# Patient Record
Sex: Male | Born: 1985 | Race: Black or African American | Hispanic: No | Marital: Single | Smoking: Current some day smoker
Health system: Southern US, Community
[De-identification: ages and names within clinical notes are randomized; demographics above are authoritative.]

## PROBLEM LIST (undated history)

## (undated) DIAGNOSIS — J40 Bronchitis, not specified as acute or chronic: Secondary | ICD-10-CM

## (undated) DIAGNOSIS — B20 Human immunodeficiency virus [HIV] disease: Secondary | ICD-10-CM

## (undated) DIAGNOSIS — J45909 Unspecified asthma, uncomplicated: Secondary | ICD-10-CM

## (undated) DIAGNOSIS — I10 Essential (primary) hypertension: Secondary | ICD-10-CM

## (undated) HISTORY — PX: HAND SURGERY: SHX662

---
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
## 2004-01-25 ENCOUNTER — Emergency Department (HOSPITAL_COMMUNITY): Admission: EM | Admit: 2004-01-25 | Discharge: 2004-01-25 | Payer: Self-pay | Admitting: Emergency Medicine

## 2004-02-13 ENCOUNTER — Emergency Department (HOSPITAL_COMMUNITY): Admission: EM | Admit: 2004-02-13 | Discharge: 2004-02-14 | Payer: Self-pay | Admitting: Emergency Medicine

## 2004-05-03 ENCOUNTER — Emergency Department (HOSPITAL_COMMUNITY): Admission: EM | Admit: 2004-05-03 | Discharge: 2004-05-03 | Payer: Self-pay | Admitting: Emergency Medicine

## 2005-01-24 ENCOUNTER — Emergency Department (HOSPITAL_COMMUNITY): Admission: EM | Admit: 2005-01-24 | Discharge: 2005-01-24 | Payer: Self-pay | Admitting: Family Medicine

## 2005-01-28 ENCOUNTER — Emergency Department (HOSPITAL_COMMUNITY): Admission: EM | Admit: 2005-01-28 | Discharge: 2005-01-28 | Payer: Self-pay | Admitting: Emergency Medicine

## 2005-01-30 ENCOUNTER — Encounter: Admission: RE | Admit: 2005-01-30 | Discharge: 2005-01-30 | Payer: Self-pay | Admitting: General Surgery

## 2005-06-14 ENCOUNTER — Emergency Department (HOSPITAL_COMMUNITY): Admission: EM | Admit: 2005-06-14 | Discharge: 2005-06-14 | Payer: Self-pay | Admitting: Emergency Medicine

## 2005-07-07 ENCOUNTER — Emergency Department (HOSPITAL_COMMUNITY): Admission: EM | Admit: 2005-07-07 | Discharge: 2005-07-07 | Payer: Self-pay | Admitting: Emergency Medicine

## 2005-07-10 ENCOUNTER — Ambulatory Visit (HOSPITAL_COMMUNITY): Admission: RE | Admit: 2005-07-10 | Discharge: 2005-07-10 | Payer: Self-pay | Admitting: Orthopedic Surgery

## 2005-10-08 ENCOUNTER — Emergency Department (HOSPITAL_COMMUNITY): Admission: EM | Admit: 2005-10-08 | Discharge: 2005-10-08 | Payer: Self-pay | Admitting: Emergency Medicine

## 2005-11-17 ENCOUNTER — Emergency Department (HOSPITAL_COMMUNITY): Admission: EM | Admit: 2005-11-17 | Discharge: 2005-11-17 | Payer: Self-pay | Admitting: Family Medicine

## 2005-11-18 ENCOUNTER — Emergency Department (HOSPITAL_COMMUNITY): Admission: EM | Admit: 2005-11-18 | Discharge: 2005-11-18 | Payer: Self-pay | Admitting: Family Medicine

## 2006-02-09 ENCOUNTER — Emergency Department (HOSPITAL_COMMUNITY): Admission: EM | Admit: 2006-02-09 | Discharge: 2006-02-09 | Payer: Self-pay | Admitting: Family Medicine

## 2006-02-14 ENCOUNTER — Emergency Department (HOSPITAL_COMMUNITY): Admission: EM | Admit: 2006-02-14 | Discharge: 2006-02-14 | Payer: Self-pay | Admitting: Family Medicine

## 2006-02-15 ENCOUNTER — Emergency Department (HOSPITAL_COMMUNITY): Admission: EM | Admit: 2006-02-15 | Discharge: 2006-02-15 | Payer: Self-pay | Admitting: Emergency Medicine

## 2006-02-16 ENCOUNTER — Emergency Department (HOSPITAL_COMMUNITY): Admission: EM | Admit: 2006-02-16 | Discharge: 2006-02-16 | Payer: Self-pay | Admitting: Emergency Medicine

## 2006-02-21 ENCOUNTER — Emergency Department (HOSPITAL_COMMUNITY): Admission: EM | Admit: 2006-02-21 | Discharge: 2006-02-21 | Payer: Self-pay | Admitting: Emergency Medicine

## 2006-04-02 ENCOUNTER — Emergency Department (HOSPITAL_COMMUNITY): Admission: EM | Admit: 2006-04-02 | Discharge: 2006-04-02 | Payer: Self-pay | Admitting: Emergency Medicine

## 2006-04-03 ENCOUNTER — Observation Stay (HOSPITAL_COMMUNITY): Admission: EM | Admit: 2006-04-03 | Discharge: 2006-04-04 | Payer: Self-pay | Admitting: Emergency Medicine

## 2006-04-03 DIAGNOSIS — B20 Human immunodeficiency virus [HIV] disease: Secondary | ICD-10-CM

## 2006-04-04 ENCOUNTER — Encounter (INDEPENDENT_AMBULATORY_CARE_PROVIDER_SITE_OTHER): Payer: Self-pay | Admitting: *Deleted

## 2006-04-12 ENCOUNTER — Emergency Department (HOSPITAL_COMMUNITY): Admission: EM | Admit: 2006-04-12 | Discharge: 2006-04-12 | Payer: Self-pay | Admitting: Emergency Medicine

## 2006-04-23 ENCOUNTER — Encounter: Admission: RE | Admit: 2006-04-23 | Discharge: 2006-04-23 | Payer: Self-pay | Admitting: Internal Medicine

## 2006-04-23 ENCOUNTER — Ambulatory Visit: Payer: Self-pay | Admitting: Internal Medicine

## 2006-04-23 LAB — CONVERTED CEMR LAB
Basophils Absolute: 0 10*3/uL (ref 0.0–0.1)
Basophils Relative: 1 % (ref 0–1)
Bilirubin Urine: NEGATIVE
Creatinine, Ser: 0.88 mg/dL (ref 0.40–1.50)
Eosinophils Absolute: 0.2 10*3/uL (ref 0.0–0.7)
Eosinophils Relative: 4 % (ref 0–5)
GC Probe Amp, Urine: NEGATIVE
HCV Ab: NEGATIVE
HIV-1 RNA Quant, Log: 4.33 — ABNORMAL HIGH (ref <1.70)
Hemoglobin: 14.2 g/dL (ref 13.0–17.0)
Hepatitis B Surface Ag: NEGATIVE
Ketones, ur: NEGATIVE mg/dL
Leukocytes, UA: NEGATIVE
Lymphs Abs: 2.3 10*3/uL (ref 0.7–3.3)
Neutrophils Relative %: 35 % — ABNORMAL LOW (ref 43–77)
Potassium: 4.1 meq/L (ref 3.5–5.3)
Protein, ur: NEGATIVE mg/dL
Sodium: 141 meq/L (ref 135–145)
Specific Gravity, Urine: 1.029 (ref 1.005–1.03)
Total Bilirubin: 0.4 mg/dL (ref 0.3–1.2)
Total Protein: 7.6 g/dL (ref 6.0–8.3)
Urobilinogen, UA: 1 (ref 0.0–1.0)

## 2006-04-30 ENCOUNTER — Telehealth: Payer: Self-pay | Admitting: Internal Medicine

## 2006-05-09 ENCOUNTER — Emergency Department (HOSPITAL_COMMUNITY): Admission: EM | Admit: 2006-05-09 | Discharge: 2006-05-09 | Payer: Self-pay | Admitting: Emergency Medicine

## 2006-05-27 ENCOUNTER — Encounter: Payer: Self-pay | Admitting: Internal Medicine

## 2006-06-27 ENCOUNTER — Emergency Department (HOSPITAL_COMMUNITY): Admission: EM | Admit: 2006-06-27 | Discharge: 2006-06-27 | Payer: Self-pay | Admitting: Emergency Medicine

## 2006-08-03 ENCOUNTER — Encounter (INDEPENDENT_AMBULATORY_CARE_PROVIDER_SITE_OTHER): Payer: Self-pay | Admitting: *Deleted

## 2006-09-10 ENCOUNTER — Emergency Department (HOSPITAL_COMMUNITY): Admission: EM | Admit: 2006-09-10 | Discharge: 2006-09-10 | Payer: Self-pay | Admitting: Emergency Medicine

## 2007-01-06 ENCOUNTER — Emergency Department (HOSPITAL_COMMUNITY): Admission: EM | Admit: 2007-01-06 | Discharge: 2007-01-06 | Payer: Self-pay | Admitting: Emergency Medicine

## 2007-01-14 ENCOUNTER — Emergency Department (HOSPITAL_COMMUNITY): Admission: EM | Admit: 2007-01-14 | Discharge: 2007-01-14 | Payer: Self-pay | Admitting: Emergency Medicine

## 2007-01-26 ENCOUNTER — Encounter: Payer: Self-pay | Admitting: Internal Medicine

## 2007-01-26 ENCOUNTER — Encounter (INDEPENDENT_AMBULATORY_CARE_PROVIDER_SITE_OTHER): Payer: Self-pay | Admitting: *Deleted

## 2007-02-25 ENCOUNTER — Emergency Department (HOSPITAL_COMMUNITY): Admission: EM | Admit: 2007-02-25 | Discharge: 2007-02-25 | Payer: Self-pay | Admitting: Emergency Medicine

## 2007-03-01 ENCOUNTER — Emergency Department (HOSPITAL_COMMUNITY): Admission: EM | Admit: 2007-03-01 | Discharge: 2007-03-01 | Payer: Self-pay | Admitting: Emergency Medicine

## 2007-09-16 ENCOUNTER — Emergency Department (HOSPITAL_COMMUNITY): Admission: EM | Admit: 2007-09-16 | Discharge: 2007-09-16 | Payer: Self-pay | Admitting: Emergency Medicine

## 2007-11-13 ENCOUNTER — Emergency Department (HOSPITAL_COMMUNITY): Admission: EM | Admit: 2007-11-13 | Discharge: 2007-11-13 | Payer: Self-pay | Admitting: Emergency Medicine

## 2008-02-06 ENCOUNTER — Emergency Department (HOSPITAL_COMMUNITY): Admission: EM | Admit: 2008-02-06 | Discharge: 2008-02-06 | Payer: Self-pay | Admitting: Emergency Medicine

## 2008-03-01 ENCOUNTER — Emergency Department (HOSPITAL_COMMUNITY): Admission: EM | Admit: 2008-03-01 | Discharge: 2008-03-01 | Payer: Self-pay | Admitting: Emergency Medicine

## 2008-05-07 ENCOUNTER — Emergency Department (HOSPITAL_COMMUNITY): Admission: EM | Admit: 2008-05-07 | Discharge: 2008-05-07 | Payer: Self-pay | Admitting: Emergency Medicine

## 2008-05-18 ENCOUNTER — Inpatient Hospital Stay (HOSPITAL_COMMUNITY): Admission: EM | Admit: 2008-05-18 | Discharge: 2008-05-22 | Payer: Self-pay | Admitting: *Deleted

## 2008-05-19 ENCOUNTER — Ambulatory Visit: Payer: Self-pay | Admitting: Infectious Diseases

## 2008-05-22 ENCOUNTER — Telehealth: Payer: Self-pay | Admitting: Infectious Diseases

## 2008-06-07 ENCOUNTER — Ambulatory Visit: Payer: Self-pay | Admitting: Infectious Diseases

## 2008-06-07 DIAGNOSIS — B59 Pneumocystosis: Secondary | ICD-10-CM

## 2008-06-13 ENCOUNTER — Ambulatory Visit: Payer: Self-pay | Admitting: Infectious Diseases

## 2008-06-13 LAB — CONVERTED CEMR LAB
AST: 15 units/L (ref 0–37)
Albumin: 3.9 g/dL (ref 3.5–5.2)
Basophils Relative: 1 % (ref 0–1)
Chloride: 103 meq/L (ref 96–112)
Creatinine, Ser: 0.84 mg/dL (ref 0.40–1.50)
GFR calc Af Amer: >60 mL/min (ref >60)
HCT: 40 % (ref 39.0–52.0)
Hemoglobin: 13 g/dL (ref 13.0–17.0)
MCV: 82.8 fL (ref 78.0–100.0)
Monocytes Absolute: 0.9 10*3/uL (ref 0.1–1.0)
Neutro Abs: 3 10*3/uL (ref 1.7–7.7)
Phosphorus: 3.7 mg/dL (ref 2.3–4.6)
Potassium: 4.6 meq/L (ref 3.5–5.3)
RDW: 19.6 % — ABNORMAL HIGH (ref 11.5–15.5)
Sodium: 140 meq/L (ref 135–145)
Total Protein: 8.2 g/dL (ref 6.0–8.3)
WBC: 5.8 10*3/uL (ref 4.0–10.5)

## 2008-06-20 ENCOUNTER — Encounter (INDEPENDENT_AMBULATORY_CARE_PROVIDER_SITE_OTHER): Payer: Self-pay | Admitting: *Deleted

## 2008-06-20 ENCOUNTER — Ambulatory Visit: Payer: Self-pay | Admitting: Infectious Diseases

## 2008-06-29 ENCOUNTER — Telehealth: Payer: Self-pay | Admitting: Infectious Diseases

## 2008-07-05 ENCOUNTER — Ambulatory Visit: Payer: Self-pay | Admitting: Infectious Diseases

## 2008-07-09 ENCOUNTER — Emergency Department (HOSPITAL_COMMUNITY): Admission: EM | Admit: 2008-07-09 | Discharge: 2008-07-09 | Payer: Self-pay | Admitting: Emergency Medicine

## 2008-07-14 ENCOUNTER — Telehealth: Payer: Self-pay | Admitting: Infectious Diseases

## 2008-07-17 ENCOUNTER — Ambulatory Visit: Payer: Self-pay | Admitting: Infectious Diseases

## 2008-07-17 ENCOUNTER — Telehealth (INDEPENDENT_AMBULATORY_CARE_PROVIDER_SITE_OTHER): Payer: Self-pay | Admitting: *Deleted

## 2008-07-17 ENCOUNTER — Encounter: Payer: Self-pay | Admitting: Infectious Disease

## 2008-07-17 LAB — CONVERTED CEMR LAB
Albumin: 4.3 g/dL (ref 3.5–5.2)
Alkaline Phosphatase: 82 units/L (ref 39–117)
CO2: 25 meq/L (ref 19–32)
Creatinine, Ser: 0.85 mg/dL (ref 0.40–1.50)
GFR calc Af Amer: >60 mL/min (ref >60)
Glucose, Bld: 86 mg/dL (ref 70–99)
Indirect Bilirubin: 0.2 mg/dL (ref 0.0–0.9)
Phosphorus: 4.2 mg/dL (ref 2.3–4.6)
Potassium: 4.2 meq/L (ref 3.5–5.3)

## 2008-07-24 ENCOUNTER — Telehealth (INDEPENDENT_AMBULATORY_CARE_PROVIDER_SITE_OTHER): Payer: Self-pay | Admitting: *Deleted

## 2008-07-27 ENCOUNTER — Encounter: Payer: Self-pay | Admitting: *Deleted

## 2008-08-15 ENCOUNTER — Encounter (INDEPENDENT_AMBULATORY_CARE_PROVIDER_SITE_OTHER): Payer: Self-pay | Admitting: *Deleted

## 2008-08-17 ENCOUNTER — Ambulatory Visit: Payer: Self-pay | Admitting: Infectious Diseases

## 2008-08-17 ENCOUNTER — Encounter: Payer: Self-pay | Admitting: Infectious Diseases

## 2008-08-17 LAB — CONVERTED CEMR LAB
ALT: 11 units/L (ref 0–53)
Alkaline Phosphatase: 76 units/L (ref 39–117)
BUN: 7 mg/dL (ref 6–23)
Calcium: 9.5 mg/dL (ref 8.4–10.5)
Chloride: 108 meq/L (ref 96–112)
Glucose, Bld: 83 mg/dL (ref 70–99)
HIV 1 RNA Quant: 556 copies/mL
Potassium: 4.1 meq/L (ref 3.5–5.3)
Total Bilirubin: 0.3 mg/dL (ref 0.3–1.2)
Total Protein: 7.5 g/dL (ref 6.0–8.3)

## 2008-09-05 ENCOUNTER — Encounter (INDEPENDENT_AMBULATORY_CARE_PROVIDER_SITE_OTHER): Payer: Self-pay | Admitting: *Deleted

## 2008-09-13 ENCOUNTER — Ambulatory Visit: Payer: Self-pay | Admitting: Infectious Diseases

## 2008-09-18 ENCOUNTER — Emergency Department (HOSPITAL_COMMUNITY): Admission: EM | Admit: 2008-09-18 | Discharge: 2008-09-18 | Payer: Self-pay | Admitting: Emergency Medicine

## 2008-11-15 ENCOUNTER — Ambulatory Visit: Payer: Self-pay | Admitting: Infectious Diseases

## 2008-11-15 ENCOUNTER — Encounter: Payer: Self-pay | Admitting: Infectious Diseases

## 2008-11-15 LAB — CONVERTED CEMR LAB
AST: 19 units/L (ref 0–37)
BUN: 10 mg/dL (ref 6–23)
CD4 Count: 214 microliters
Creatinine, Ser: 1.08 mg/dL (ref 0.40–1.50)

## 2008-12-19 ENCOUNTER — Emergency Department (HOSPITAL_COMMUNITY): Admission: EM | Admit: 2008-12-19 | Discharge: 2008-12-19 | Payer: Self-pay | Admitting: Family Medicine

## 2009-01-03 ENCOUNTER — Ambulatory Visit: Payer: Self-pay | Admitting: Infectious Diseases

## 2009-01-03 LAB — CONVERTED CEMR LAB
ALT: 15 units/L (ref 0–53)
AST: 20 units/L (ref 0–37)
Albumin: 4.4 g/dL (ref 3.5–5.2)
Alkaline Phosphatase: 81 units/L (ref 39–117)
BUN: 9 mg/dL (ref 6–23)
Bilirubin, Direct: 0.1 mg/dL (ref 0.0–0.3)
Cholesterol: 94 mg/dL (ref 0–200)
Glucose, Bld: 83 mg/dL (ref 70–99)
Indirect Bilirubin: 0.1 mg/dL (ref 0.0–0.9)
LDL Cholesterol: 52 mg/dL (ref 0–99)
Potassium: 4.3 meq/L (ref 3.5–5.3)
Sodium: 139 meq/L (ref 135–145)
Total Bilirubin: 0.2 mg/dL — ABNORMAL LOW (ref 0.3–1.2)
Total Protein: 7.4 g/dL (ref 6.0–8.3)
VLDL: 16 mg/dL (ref 0–40)

## 2009-01-09 ENCOUNTER — Ambulatory Visit: Payer: Self-pay | Admitting: Infectious Diseases

## 2009-01-09 LAB — CONVERTED CEMR LAB: CD4 Count: 281 microliters

## 2009-01-23 ENCOUNTER — Telehealth (INDEPENDENT_AMBULATORY_CARE_PROVIDER_SITE_OTHER): Payer: Self-pay | Admitting: *Deleted

## 2009-01-24 ENCOUNTER — Ambulatory Visit: Payer: Self-pay | Admitting: Infectious Disease

## 2009-01-24 DIAGNOSIS — H679 Otitis media in diseases classified elsewhere, unspecified ear: Secondary | ICD-10-CM

## 2009-01-24 DIAGNOSIS — R51 Headache: Secondary | ICD-10-CM

## 2009-01-24 DIAGNOSIS — R519 Headache, unspecified: Secondary | ICD-10-CM | POA: Insufficient documentation

## 2009-01-24 LAB — CONVERTED CEMR LAB
ALT: 15 units/L (ref 0–53)
AST: 18 units/L (ref 0–37)
Alkaline Phosphatase: 85 units/L (ref 39–117)
BUN: 8 mg/dL (ref 6–23)
CRP: 0.1 mg/dL (ref <0.6)
Calcium: 9.1 mg/dL (ref 8.4–10.5)
Creatinine, Ser: 0.93 mg/dL (ref 0.40–1.50)
Eosinophils Absolute: 0.5 10*3/uL (ref 0.0–0.7)
HCT: 39.7 % (ref 39.0–52.0)
Hemoglobin: 13.9 g/dL (ref 13.0–17.0)
Lymphs Abs: 2.3 10*3/uL (ref 0.7–4.0)
MCV: 86.3 fL (ref >=78.0)
Neutro Abs: 1.6 10*3/uL — ABNORMAL LOW (ref 1.7–7.7)
Platelets: 277 10*3/uL (ref 150–400)
Potassium: 4.8 meq/L (ref 3.5–5.3)
RBC: 4.6 M/uL (ref 4.22–5.81)
RDW: 14.6 % (ref 11.5–15.5)
Sed Rate: 3 mm/hr (ref 0–16)
Total Bilirubin: 0.2 mg/dL — ABNORMAL LOW (ref 0.3–1.2)

## 2009-02-01 ENCOUNTER — Ambulatory Visit: Payer: Self-pay | Admitting: Infectious Diseases

## 2009-02-02 ENCOUNTER — Ambulatory Visit (HOSPITAL_COMMUNITY): Admission: RE | Admit: 2009-02-02 | Discharge: 2009-02-02 | Payer: Self-pay | Admitting: Infectious Diseases

## 2009-03-12 ENCOUNTER — Ambulatory Visit: Payer: Self-pay | Admitting: Infectious Diseases

## 2009-03-12 LAB — CONVERTED CEMR LAB
CD4 Count: 376 microliters
CO2: 28 meq/L (ref 19–32)
Calcium: 9.5 mg/dL (ref 8.4–10.5)
Chloride: 105 meq/L (ref 96–112)
Indirect Bilirubin: 0.1 mg/dL (ref 0.0–0.9)
Total Bilirubin: 0.2 mg/dL — ABNORMAL LOW (ref 0.3–1.2)

## 2009-06-13 ENCOUNTER — Ambulatory Visit: Payer: Self-pay | Admitting: Infectious Diseases

## 2009-06-13 DIAGNOSIS — F410 Panic disorder [episodic paroxysmal anxiety] without agoraphobia: Secondary | ICD-10-CM

## 2009-06-20 ENCOUNTER — Ambulatory Visit: Payer: Self-pay | Admitting: Infectious Diseases

## 2009-06-20 LAB — CONVERTED CEMR LAB
ALT: 23 units/L (ref 0–53)
AST: 21 units/L (ref 0–37)
Albumin: 4.5 g/dL (ref 3.5–5.2)
Alkaline Phosphatase: 75 units/L (ref 39–117)
BUN: 7 mg/dL (ref 6–23)
Bacteria, UA: NONE SEEN
Bilirubin Urine: NEGATIVE
Bilirubin, Direct: 0.1 mg/dL (ref 0.0–0.3)
CO2: 25 meq/L (ref 19–32)
Calcium: 9.4 mg/dL (ref 8.4–10.5)
Chloride: 107 meq/L (ref 96–112)
Cholesterol: 97 mg/dL (ref 0–200)
Crystals: NONE SEEN
HDL: 29 mg/dL — ABNORMAL LOW (ref >39)
HIV 1 RNA Quant: 39 copies/mL
Ketones, ur: NEGATIVE mg/dL
Potassium: 4.3 meq/L (ref 3.5–5.3)
Sodium: 141 meq/L (ref 135–145)
Squamous Epithelial / LPF: NONE SEEN /lpf
Total Protein: 7.5 g/dL (ref 6.0–8.3)
Triglycerides: 100 mg/dL (ref <150)
Urobilinogen, UA: 1 (ref 0.0–1.0)

## 2009-06-21 ENCOUNTER — Telehealth: Payer: Self-pay | Admitting: Infectious Diseases

## 2009-07-12 ENCOUNTER — Telehealth: Payer: Self-pay | Admitting: Infectious Diseases

## 2009-07-16 ENCOUNTER — Telehealth: Payer: Self-pay | Admitting: Internal Medicine

## 2009-07-16 ENCOUNTER — Encounter: Payer: Self-pay | Admitting: Internal Medicine

## 2009-07-16 LAB — CONVERTED CEMR LAB: Chlamydia, Swab/Urine, PCR: NEGATIVE

## 2009-07-18 ENCOUNTER — Telehealth: Payer: Self-pay | Admitting: Internal Medicine

## 2009-07-20 ENCOUNTER — Ambulatory Visit: Payer: Self-pay | Admitting: Internal Medicine

## 2009-08-16 ENCOUNTER — Encounter (INDEPENDENT_AMBULATORY_CARE_PROVIDER_SITE_OTHER): Payer: Self-pay | Admitting: *Deleted

## 2009-09-22 ENCOUNTER — Emergency Department (HOSPITAL_COMMUNITY): Admission: EM | Admit: 2009-09-22 | Discharge: 2009-09-22 | Payer: Self-pay | Admitting: Emergency Medicine

## 2009-10-08 ENCOUNTER — Ambulatory Visit: Payer: Self-pay | Admitting: Internal Medicine

## 2009-10-08 ENCOUNTER — Encounter: Payer: Self-pay | Admitting: Infectious Diseases

## 2009-10-08 LAB — CONVERTED CEMR LAB
ALT: 13 units/L (ref 0–53)
BUN: 7 mg/dL (ref 6–23)
CO2: 26 meq/L (ref 19–32)
Calcium: 9.5 mg/dL (ref 8.4–10.5)
Chloride: 102 meq/L (ref 96–112)
Creatinine, Ser: 0.84 mg/dL (ref 0.40–1.50)
HIV 1 RNA Quant: 39 copies/mL
Phosphorus: 3 mg/dL (ref 2.3–4.6)
Total Protein: 6.9 g/dL (ref 6.0–8.3)

## 2009-12-19 ENCOUNTER — Emergency Department (HOSPITAL_COMMUNITY): Admission: EM | Admit: 2009-12-19 | Discharge: 2009-12-19 | Payer: Self-pay | Admitting: Emergency Medicine

## 2009-12-25 ENCOUNTER — Encounter (INDEPENDENT_AMBULATORY_CARE_PROVIDER_SITE_OTHER): Payer: Self-pay | Admitting: *Deleted

## 2009-12-27 ENCOUNTER — Telehealth: Payer: Self-pay | Admitting: Internal Medicine

## 2010-01-29 ENCOUNTER — Ambulatory Visit
Admission: RE | Admit: 2010-01-29 | Discharge: 2010-01-29 | Payer: Self-pay | Source: Home / Self Care | Attending: Internal Medicine | Admitting: Internal Medicine

## 2010-01-29 ENCOUNTER — Encounter: Payer: Self-pay | Admitting: Infectious Diseases

## 2010-01-29 ENCOUNTER — Encounter: Payer: Self-pay | Admitting: Internal Medicine

## 2010-01-29 LAB — CONVERTED CEMR LAB
Albumin: 4.8 g/dL (ref 3.5–5.2)
CO2: 26 meq/L (ref 19–32)
Calcium: 9.6 mg/dL (ref 8.4–10.5)
Creatinine, Ser: 0.94 mg/dL (ref 0.40–1.50)
Glucose, Bld: 80 mg/dL (ref 70–99)
HIV 1 RNA Quant: 39 copies/mL
Total Protein: 7.2 g/dL (ref 6.0–8.3)

## 2010-02-07 ENCOUNTER — Encounter: Payer: Self-pay | Admitting: Internal Medicine

## 2010-02-07 ENCOUNTER — Ambulatory Visit
Admission: RE | Admit: 2010-02-07 | Discharge: 2010-02-07 | Payer: Self-pay | Source: Home / Self Care | Attending: Internal Medicine | Admitting: Internal Medicine

## 2010-02-07 ENCOUNTER — Encounter
Admission: RE | Admit: 2010-02-07 | Discharge: 2010-02-07 | Payer: Self-pay | Source: Home / Self Care | Attending: Internal Medicine | Admitting: Internal Medicine

## 2010-02-07 DIAGNOSIS — R634 Abnormal weight loss: Secondary | ICD-10-CM | POA: Insufficient documentation

## 2010-02-07 DIAGNOSIS — G4700 Insomnia, unspecified: Secondary | ICD-10-CM | POA: Insufficient documentation

## 2010-02-07 LAB — CONVERTED CEMR LAB

## 2010-02-13 ENCOUNTER — Telehealth: Payer: Self-pay | Admitting: Internal Medicine

## 2010-02-24 LAB — CONVERTED CEMR LAB
ALT: 25 units/L (ref 0–53)
AST: 23 units/L (ref 0–37)
Alkaline Phosphatase: 109 units/L (ref 39–117)
BUN: 11 mg/dL (ref 6–23)
BUN: 7 mg/dL (ref 6–23)
BUN: 8 mg/dL (ref 6–23)
Basophils Relative: 1 % (ref 0–1)
Bilirubin, Direct: 0.1 mg/dL (ref 0.0–0.3)
Blood in Urine, dipstick: NEGATIVE
CD4 Count: 13 microliters
Calcium: 9.2 mg/dL (ref 8.4–10.5)
Chloride: 103 meq/L (ref 96–112)
Chloride: 108 meq/L (ref 96–112)
Creatinine, Ser: 0.86 mg/dL (ref 0.40–1.50)
Creatinine, Ser: 1.13 mg/dL (ref 0.40–1.50)
Eosinophils Relative: 5 % (ref 0–5)
GFR calc Af Amer: >60 mL/min (ref >60)
GFR calc non Af Amer: >60 mL/min (ref >60)
GFR calc non Af Amer: >60 mL/min (ref >60)
Glucose, Bld: 72 mg/dL (ref 70–99)
Glucose, Bld: 82 mg/dL (ref 70–99)
HDL: 31 mg/dL — ABNORMAL LOW (ref >39)
HDL: 38 mg/dL — ABNORMAL LOW (ref >39)
HIV 1 RNA Quant: 1003883 copies/mL
HIV 1 RNA Quant: 1003883 copies/mL
HIV 1 RNA Quant: 187 copies/mL
Hemoglobin: 13.9 g/dL (ref 13.0–17.0)
Indirect Bilirubin: 0.2 mg/dL (ref 0.0–0.9)
Ketones, urine, test strip: NEGATIVE
LDL Cholesterol: 74 mg/dL (ref 0–99)
Lymphocytes Relative: 50 % — ABNORMAL HIGH (ref 12–46)
MCHC: 31.4 g/dL (ref 30.0–36.0)
MCV: 86.4 fL (ref >=78.0)
Monocytes Absolute: 0.5 10*3/uL (ref 0.1–1.0)
Neutrophils Relative %: 35 % — ABNORMAL LOW (ref 43–77)
Nitrite: NEGATIVE
Platelets: 283 10*3/uL (ref 150–400)
Potassium: 4.2 meq/L (ref 3.5–5.3)
RDW: 14.7 % (ref 11.5–15.5)
Sodium: 138 meq/L (ref 135–145)
Sodium: 141 meq/L (ref 135–145)
Specific Gravity, Urine: >=1.03
Total Bilirubin: 0.3 mg/dL (ref 0.3–1.2)
Total CHOL/HDL Ratio: 3.4
Total Protein: 9 g/dL — ABNORMAL HIGH (ref 6.0–8.3)
Urobilinogen, UA: 0.2
VLDL: 17 mg/dL (ref 0–40)
pH: 6

## 2010-02-26 NOTE — Miscellaneous (Signed)
Summary: HIV-1 RNA, CD4 (RESEARCH)  Clinical Lists Changes  Observations: Added new observation of CD4 COUNT: 376 microliters (03/12/2009 8:33) Added new observation of HIV1RNA QA: 39 copies/mL (03/12/2009 8:33)

## 2010-02-26 NOTE — Assessment & Plan Note (Signed)
Summary: bicillin injection #1  Prior Medications: PROVENTIL HFA 108 (90 BASE) MCG/ACT AERS (ALBUTEROL SULFATE) one puff every 6 hours as needed for shortness of breath TRUVADA 200-300 MG TABS (EMTRICITABINE-TENOFOVIR) 1 by mouth daily PREZISTA 400 MG TABS (DARUNAVIR ETHANOLATE) 2 by mouth daily NORVIR 100 MG TABS (RITONAVIR) 1 by mouth daily with darunavir and food ALPRAZOLAM 0.25 MG TABS (ALPRAZOLAM) one by mouth every 8 hours as needed for anxiety Current Allergies: No known allergies  Medication Administration  Injection # 1:    Medication: Bicillin LA 1.2 million units Injection    Diagnosis: CONTACT WITH OR EXPOSURE TO VENEREAL DISEASES (ICD-V01.6)    Route: IM    Site: RUOQ gluteus    Exp Date: 03/14    Lot #: [id number]    Mfr: king    Patient tolerated injection without complications    Given by: Wendall Mola CMA Duncan Dull) (July 20, 2009 10:39 AM)  Injection # 2:    Medication: Bicillin LA 1.2 million units Injection    Diagnosis: CONTACT WITH OR EXPOSURE TO VENEREAL DISEASES (ICD-V01.6)    Route: IM    Site: LUOQ gluteus    Exp Date: 03/14    Lot #: [id number]    Mfr: king    Patient tolerated injection without complications    Given by: Wendall Mola CMA Duncan Dull) (July 20, 2009 10:39 AM)  Orders Added: 1)  Bicillin LA 1.2 million units Injection [J0561] 2)  Bicillin LA 1.2 million units Injection [J0561] 3)  Admin of Therapeutic Inj  intramuscular or subcutaneous [41660]

## 2010-02-26 NOTE — Miscellaneous (Signed)
  Clinical Lists Changes  Observations: Added new observation of YEARAIDSPOS: 2010  (12/25/2009 11:49)

## 2010-02-26 NOTE — Progress Notes (Signed)
  Phone Note Call from Patient Call back at Home Phone [phone]   Reason for Call: Talk to Nurse Summary of Call: Patient called and said he had gone to the ED recently for SOB and asthma. Was given a breathing tx and steroids and said he was still having some dsicomfort in his chest. He said he was trying to wait until he had his next appt with me, I instructed him to make an appt to see Dr. Philipp Deputy or Nida Boatman urgently and to not wait for signs.Deirdre Evener RN  December 27, 2009 4:14 PM Initial call taken by: Deirdre Evener RN,  December 27, 2009 4:14 PM

## 2010-02-26 NOTE — Miscellaneous (Signed)
Summary: immunizations    Immunizations Administered:  Hepatitis A Vaccine # 2:    Vaccine Type: HepA    Site: right deltoid    Mfr: GlaxoSmithKline    Dose: 0.5 ml    Route: IM    Given by: Starleen Arms CMA    Exp. Date: 05/16/2011    Lot #: [id number]    VIS given: 04/16/04 version given February 01, 2009.

## 2010-02-26 NOTE — Assessment & Plan Note (Signed)
Summary: f/u [mkj]   Primary Provider:  Clydie Braun MD  CC:  follow-up visit / panic attacks.  History of Present Illness: 25 yo with well controlled HIV with most recent CD4 of 376  and VL<39 on ACTG 5257 randomized to prezista, norvir and truvada who had PCP pna last spring.  Currently he is doing ok with his meds.  His main issue is that he is having panic attacks.  His last one was 3 days ago, at a [employer] - felt like he couldnt catch his breath, started shaking and chest was tightening.  He tried his inhaler and that worked a little but then sxs recurred so his motehr gave him a xanax and that helped.  He is taking his meds regularly.  100% compliance no side effects  Preventive Screening-Counseling & Management  Alcohol-Tobacco     Alcohol drinks/day: 0     Smoking Status: current     Smoking Cessation Counseling: yes     Smoke Cessation Stage: precontemplative     Packs/Day: <0.25  Caffeine-Diet-Exercise     Caffeine use/day: 1 12 oz. can of soda per day     Does Patient Exercise: yes     Type of exercise: walking     Exercise (avg: min/session): 30-60     Times/week: <3  Safety-Violence-Falls     Seat Belt Use: yes   Updated Prior Medication List: PROVENTIL HFA 108 (90 BASE) MCG/ACT AERS (ALBUTEROL SULFATE) one puff every 6 hours as needed for shortness of breath TRUVADA 200-300 MG TABS (EMTRICITABINE-TENOFOVIR) 1 by mouth daily PREZISTA 400 MG TABS (DARUNAVIR ETHANOLATE) 2 by mouth daily NORVIR 100 MG TABS (RITONAVIR) 1 by mouth daily with darunavir and food  Current Allergies (reviewed today): No known allergies  Past History:  Past Medical History: Last updated: 01/24/2009 HIV  GC  CHlamydia Thrush PCP - presumed 4/10 Headache  Past Surgical History: Last updated: 06/07/2008 L hand surg due to cut  Family History: Last updated: 06/07/2008 [place]  Social History: Last updated: 06/13/2009 Lives with friend and partner TObacco - 3  cigs per day ETOH - occas No other drugs  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    Risk Factors: Alcohol Use: 0 (06/13/2009) Caffeine Use: 1 12 oz. 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    Review of Systems       11 systems reviewed and negative except per HPI   Vital Signs:  Patient profile:   25 year old male Height:      69 inches (175.26 cm) Weight:      136.4 pounds (62 kg) BMI:     20.22 Temp:     98.0 degrees F (36.67 degrees C) oral Pulse rate:   49 / minute BP sitting:   129 / 84  (left arm)  Vitals Entered By: Baxter Hire) (Jun 13, 2009 9:28 AM) CC: follow-up  visit / panic attacks Pain Assessment Patient in pain? no      Nutritional Status BMI of 19 -24 = normal Nutritional Status Detail appetite is good per patient  Have you ever been in a relationship where you felt threatened, hurt or afraid?No   Does patient need assistance? Functional Status Self care Ambulation Normal   Physical Exam  General:  alert.  Thin Head:  normocephalic.   Eyes:  vision grossly intact, pupils equal, and pupils round.   Ears:  R ear normal and L ear normal.   Nose:  no external deformity.   Mouth:  good dentition.   Neck:  supple.   Lungs:  normal respiratory effort and normal breath sounds.   Heart:  normal rate and regular rhythm.   Abdomen:  soft and non-tender.   Msk:  normal ROM and no joint tenderness.     Extremities:  no cce Neurologic:  alert & oriented X3 and cranial nerves II-XII intact.   Skin:  no rashes.   Cervical Nodes:  no anterior cervical adenopathy and no posterior cervical adenopathy.   Psych:  Oriented X3 and memory intact for recent and remote.          Medication Adherence: 06/13/2009   Adherence to medications reviewed with patient. Counseling to provide adequate adherence provided   Prevention For Positives: 06/13/2009   Safe sex practices discussed with patient. Condoms offered.                             Impression & Recommendations:  Problem # 1:  HIV INFECTION (ICD-042)  Patient appears to controlling his virus well and reconstituting his cd4 now up to >300.  COnt current regimen.  f/u 6 months with Brad. 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 Take one to two at night for pain    Sumatriptan Succinate 50 Mg Tabs (Sumatriptan succinate) .Marland Kitchen... Take one tablet at start of headache and may repeat after 2 hours if no benefit.  may repeat the next day if necessary  Problem # 3:  PANIC ATTACK, ACUTE (ICD-300.01)  He has had 2 or 3 panic attacks - went to [facility] [place] and told he had a panic  attack. He has no psychiatric history.  He really says he has no idea why this is happening.  WIllgive a small number of xanax - he is not much of an abuse potential.    His updated medication list for this problem includes:    Alprazolam 0.25 Mg Tabs (Alprazolam) ..... One by mouth every 8 hours as needed for anxiety  Problem # 4:  PREVENTIVE HEALTH CARE (ICD-V70.0) up to date. will consider anal pap in future  Medications  Added to Medication List This Visit: 1)  Alprazolam 0.25 Mg Tabs (Alprazolam) .... One by mouth every 8 hours as needed for anxiety  Patient Instructions: 1)  FOllow up with Brad in 5-6 months. 2)  Continue to follow with Selena Batten for research. Prescriptions: ALPRAZOLAM 0.25 MG TABS (ALPRAZOLAM) one by mouth every 8 hours as needed for anxiety  #20 x 0   Entered and Authorized by:   Clydie Braun MD   Signed by:   Clydie Braun MD on 06/13/2009   Method used:   Print then Give to Patient   RxID:   [id number]

## 2010-02-26 NOTE — Assessment & Plan Note (Signed)
Summary: FLU SHOT  Prior Medications: PROVENTIL HFA 108 (90 BASE) MCG/ACT AERS (ALBUTEROL SULFATE) one puff every 6 hours as needed for shortness of breath TRUVADA 200-300 MG TABS (EMTRICITABINE-TENOFOVIR) 1 by mouth daily PREZISTA 400 MG TABS (DARUNAVIR ETHANOLATE) 2 by mouth daily NORVIR 100 MG TABS (RITONAVIR) 1 by mouth daily with darunavir and food ALPRAZOLAM 0.25 MG TABS (ALPRAZOLAM) one by mouth every 8 hours as needed for anxiety Current Allergies: No known allergies  Immunizations Administered:  Influenza Vaccine # 1:    Vaccine Type: Fluvax MCR    Site: left deltoid    Mfr: novartis    Dose: 0.5 ml    Route: IM    Given by: Deirdre Evener RN    Exp. Date: 04/28/2010    Lot #: [id number]    VIS given: 08/21/09 version given October 08, 2009.  Flu Vaccine Consent Questions:    Do you have a history of severe allergic reactions to this vaccine? no    Any prior history of allergic reactions to egg and/or gelatin? no    Do you have a sensitivity to the preservative Thimersol? no    Do you have a past history of Guillan-Barre Syndrome? no    Do you currently have an acute febrile illness? no    Have you ever had a severe reaction to latex? no    Vaccine information given and explained to patient? yes  Orders Added: 1)  Influenza Vaccine MCR [00025]   Orders Added: 1)  Influenza Vaccine MCR [00025]

## 2010-02-26 NOTE — Miscellaneous (Signed)
Summary: HIV-1 RNA, CD4 (RESEARCH)  Clinical Lists Changes  Observations: Added new observation of CD4 COUNT: 440 microliters (06/20/2009 14:55) Added new observation of HIV1RNA QA: 39 copies/mL (06/20/2009 14:55)

## 2010-02-26 NOTE — Assessment & Plan Note (Signed)
Summary: STUDY APPT/ LH    Current Allergies: No known allergies  Social History: Tobacco Use:  yes  Vital Signs:  Patient profile:   25 year old male Weight:      141.7 pounds (64.41 kg) BMI:     21.00 Temp:     96.7 degrees F oral Pulse rate:   62 / minute Resp:     18 per minute BP sitting:   126 / 76  (right arm) Is Patient Diabetic? No Pain Assessment Patient in pain? no      Nutritional Status BMI of 19 -24 = normal  Does patient need assistance? Functional Status Self care Ambulation Normal   Patient here for week 36 study visit. He says his headaches have resolved around the first of the month. He has 2 areas of dry, scaly itchy dark patches on his left upper arm and midback. I recommended he try some cortisone cream to see if that would help them clear up. If they get worse he is to call back for an appt.Deirdre Evener RN  March 12, 2009 12:09 PM    Complete Medication List: 1)  Proventil Hfa 108 (90 Base) Mcg/act Aers (Albuterol sulfate) .... One puff every 6 hours as needed for shortness of breath 2)  Truvada 200-300 Mg Tabs (Emtricitabine-tenofovir) .Marland Kitchen.. 1 by mouth daily 3)  Prezista 400 Mg Tabs (Darunavir ethanolate) .... 2 by mouth daily 4)  Norvir 100 Mg Tabs (Ritonavir) .Marland Kitchen.. 1 by mouth daily with darunavir and food 5)  Augmentin 875-125 Mg Tabs (Amoxicillin-pot clavulanate) .... Take 1 tablet by mouth two times a day for ten days 6)  Vicodin Es 7.5-750 Mg Tabs (Hydrocodone-acetaminophen) .... Take one to two at night for pain 7)  Sumatriptan Succinate 50 Mg Tabs (Sumatriptan succinate) .... Take one tablet at start of headache and may repeat after 2 hours if no benefit.  may repeat the next day if necessary 8)  Zyrtec-d Allergy & Congestion 5-120 Mg Xr12h-tab (Cetirizine-pseudoephedrine) .... One by mouth two times a day 9)  Fluticasone Propionate 50 Mcg/act Susp (Fluticasone propionate) .... 2 sprays each nostril once a day  Other Orders: Est. Patient  Research Study [id number]) T-Basic Metabolic Panel [phone]) T-Hepatic Function [phone]) T-Phosphorus [phone])  Process Orders Check Orders Results:     [facility]: ABN not required for this insurance Tests Sent for requisitioning (March 14, 2009 10:34 PM):     03/12/2009: [facility] -- T-Basic Metabolic Panel [id number] (signed)     03/12/2009: [facility] -- T-Hepatic Function [id number] (signed)     03/12/2009: [facility] -- T-Phosphorus [phone] (signed)

## 2010-02-26 NOTE — Miscellaneous (Signed)
Summary: HIV-1 RNA, CD4 (RESEARCH)  Clinical Lists Changes  Observations: Added new observation of CD4 COUNT: 521 microliters (10/08/2009 10:18) Added new observation of HIV1RNA QA: 39 copies/mL (10/08/2009 10:18)

## 2010-02-26 NOTE — Miscellaneous (Signed)
Summary: HIV-1 RNA  (RESEARCH)  Clinical Lists Changes  Observations: Added new observation of HIV1RNA QA: 39 copies/mL (01/03/2009 9:10)

## 2010-02-26 NOTE — Progress Notes (Signed)
Summary: [facility] called re: pt listed as contact for positive syphilis case  Phone Note Call from Patient   Summary of Call: pt walked in for testing.  Previous note  indicates he is here to have RPR repeated due to report from [facility].  He was called and listed as a contact. Tomasita Morrow RN  July 16, 2009 9:33 AM   Follow-up for Phone Call        Ladonna at [facility] states pt's current partner tested positive for syphilis.  They would like [place] to treat [name].  We will perform test to see if pt is infected. At that time I will check with Dr Philipp Deputy to see if she suggest treatment.  Additional Follow-up for Phone Call Additional follow up Details #1::        when was he exposed? Additional Follow-up by: Yisroel Ramming MD,  July 17, 2009 2:47 PM  New Problems: CONTACT WITH OR EXPOSURE TO VENEREAL DISEASES (ICD-V01.6)   New Problems: CONTACT WITH OR EXPOSURE TO VENEREAL DISEASES (ICD-V01.6)

## 2010-02-26 NOTE — Miscellaneous (Signed)
Summary:  CD4 (RESEARCH)  Clinical Lists Changes  Observations: Added new observation of CD4 COUNT: 281 microliters (01/09/2009 9:10)

## 2010-02-26 NOTE — Progress Notes (Signed)
  Phone Note Other Incoming   Caller: Avelino Leeds -[facility] Reason for Call: Discuss lab or test results Action Taken: Appt scheduled Summary of Call: Christiane Ha had called and wanted me to talk with Donnella Sham from the [facility]. She said his partner tested positive for syphilis and he needed to be tested. He had a negative test in Dec, 2010. Christiane Ha is not aware of the partner's status, because he said he had only been with him past 4 months and the partner had not told him of his status. I told Christiane Ha to come in for a retest, but I did not tell him why. He is coming Monday am, the 20th. Donnella Sham wanted to be told results and treatment if required. Initial call taken by: Deirdre Evener RN,  July 12, 2009 12:10 PM

## 2010-02-26 NOTE — Assessment & Plan Note (Signed)
Summary: STUDY APPT/ LH    Current Allergies: No known allergies  Vital Signs:  Patient profile:   25 year old male Weight:      131.4 pounds (59.73 kg) BMI:     19.47 Temp:     98.3 degrees F oral Pulse rate:   64 / minute Resp:     16 per minute BP sitting:   123 / 82  (right arm) Is Patient Diabetic? No Pain Assessment Patient in pain? no      Nutritional Status BMI of 19 -24 = normal  Does patient need assistance? Functional Status Self care Ambulation Normal   Patient here for week 64 study visit. He is c/o past 3 weeks of rt frontal headache intermittent, becomes incapacitating similar experience to when he was treated first of this year for headaches. He is to schedule an appt to see Dr. Philipp Deputy soon. He also says that he was in the ED this past month for an asthma "attack". Flu vaccine given today.Deirdre Evener RN  October 08, 2009 10:28 AM   Other Orders: Est. Patient Research Study [id number]) T-Basic Metabolic Panel [phone]) T-Hepatic Function [phone]) T-Phosphorus [phone])

## 2010-02-26 NOTE — Miscellaneous (Signed)
Summary: clinical update/ryan white  Clinical Lists Changes  Observations: Added new observation of PCTFPL: 79.34  (08/16/2009 12:13) Added new observation of INCOMESOURCE: SSI  (08/16/2009 12:13) Added new observation of YEARLYEXPEN: 900  (08/16/2009 12:13) Added new observation of HOUSEINCOME: 8592  (08/16/2009 12:13) Added new observation of FINASSESSDT: 08/16/2009  (08/16/2009 12:13)

## 2010-02-26 NOTE — Progress Notes (Signed)
Summary: Poss. treatment for syphilis  Phone Note Other Incoming   Caller: [facility] Summary of Call: recent exposure within the last 2-3 weeks .  [facility] is his current live in partner that is positive. [name]  [phone] Initial call taken by: Tomasita Morrow RN,  July 18, 2009 12:23 PM  Follow-up for Phone Call        ok PCN 2.4 million units IM x1 Follow-up by: Yisroel Ramming MD,  July 19, 2009 3:27 PM  Additional Follow-up for Phone Call Additional follow up Details #1::        ok will inform patient Additional Follow-up by: Starleen Arms CMA,  July 19, 2009 3:39 PM

## 2010-02-26 NOTE — Progress Notes (Signed)
  Phone Note Call from Patient Call back at Home Phone [phone]   Reason for Call: Acute Illness, Talk to Nurse Complaint: Headache Summary of Call: Patient called and said he has been having a bad headache all day on the left side of his head. He thinks it is his sinuses, because he had troublewith them before. he is going to try some sinus advil and he will call back in the morning if he still is feeling bad. Also, a cough noted, no fever. I told him to try try a hot shower or bath and see if that helped his sinuses drain this evening.Deirdre Evener RN  Jun 21, 2009 4:29 PM  Initial call taken by: Deirdre Evener RN,  Jun 21, 2009 4:29 PM

## 2010-02-26 NOTE — Assessment & Plan Note (Signed)
Summary: F/U/VS   Visit Type:  Follow-up Primary Provider:  Clydie Braun MD  CC:  f/u and having frequent headaches and right eye blurry.  History of Present Illness: 25 yo with well controlled HIV with most recent CD4 of 280 on ACTG 5257 randomized to prezista, norvir and truvada who had PCP pna last spring.   Currently is taking the augmentin. helped the first day and made the pain go away but returned again.  Only hurts at night when trying to sleep.  Not taking ibuprofen or any other meds.  Pain is mainly on R side.  No ST, fevers chills or sinus sxs.  No cough. Bloodwork was wnl with nml wbc, esr and crp and neg crypto and rpr. No focal neuro defects, no ams.     Dr Zenaida Niece dam note from 12/29 He apparently had severe headaches while in the hospital at that time as well and had serum crypto ag checked which was negative but no LP. HA;s resolved. HOwever within the past 3 nights he has had recurrence of similar  headache on the right side at night posteriorly, ear, and right side of face, jaw rates pain at 8-9/10 occurs at night. He has some associated dizziness with the headaches. He dropped to the floor secondary to the pain and crawled to the bathroom. He was severly lightheaded and scared that he was "having a stroke." He has tried tylenol without relief. He then tried one of his mothers vicodins that did relieve the pain and allow him to go to sleep. He has been putting hydrogen peroxide in his right ear to see if this would help but it has not. He denies fevers or chills, sweats or nausea. He has had no visual phenemonena with these headaches.  Preventive Screening-Counseling & Management  Alcohol-Tobacco     Alcohol drinks/day: 0     Smoking Status: never     Packs/Day: 0.5 week  Caffeine-Diet-Exercise     Caffeine use/day: 1 12 oz. can of soda per day     Does Patient Exercise: yes     Type of exercise: walking     Exercise (avg: min/session): 30-60     Times/week: <3     Updated Prior Medication List: PROVENTIL HFA 108 (90 BASE) MCG/ACT AERS (ALBUTEROL SULFATE) one puff every 6 hours as needed for shortness of breath TRUVADA 200-300 MG TABS (EMTRICITABINE-TENOFOVIR) 1 by mouth daily PREZISTA 400 MG TABS (DARUNAVIR ETHANOLATE) 2 by mouth daily NORVIR 100 MG TABS (RITONAVIR) 1 by mouth daily with darunavir and food AUGMENTIN 875-125 MG TABS (AMOXICILLIN-POT CLAVULANATE) Take 1 tablet by mouth two times a day for ten days VICODIN ES 7.5-750 MG TABS (HYDROCODONE-ACETAMINOPHEN) take one to two at night for pain  Current Allergies (reviewed today): No known allergies  Past History:  Past Medical History: Last updated: 01/24/2009 HIV  GC  CHlamydia Thrush PCP - presumed 4/10 Headache  Past Surgical History: Last updated: 06/07/2008 L hand surg due to cut  Family History: Last updated: 06/07/2008 [place]  Social History: Last updated: 06/07/2008 Lives with friend and partner TObacco - 3 cigs per day ETOH - occas No other drugs  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 can of soda per day (02/01/2009) Exercise: yes (02/01/2009)  Risk Factors: Smoking Status: never (02/01/2009) Packs/Day: 0.5 week (02/01/2009)  Review of Systems  11 systems reviewed and negative except per HPI   Vital Signs:  Patient profile:   25 year old male Height:      69 inches (175.26 cm) Weight:      140.06 pounds (63.66 kg) BMI:     20.76 Temp:     97.1 degrees F (36.17 degrees C) oral Pulse rate:   91 / minute BP sitting:   135 / 88  (left arm)  Vitals Entered By: Starleen Arms CMA (February 01, 2009 9:32 AM) CC: f/u and having frequent headaches, right eye blurry Is Patient Diabetic? No Pain Assessment Patient in pain? yes     Location: head Intensity: 7 Type: aching Nutritional Status BMI of 19 -24 =  normal Nutritional Status Detail nl  Does patient need assistance? Functional Status Self care Ambulation Normal   Physical Exam  General:  well-developed.  appears in mild discomfort Head:  normocephalic.   some tenderness on palp over L scalp but no lesions or induration Eyes:  vision grossly intact - poor but at baseline per patient  Ears:  R ear normal and L ear normal.   Nose:  wnl Mouth:  fair dentition -  cavity at L molar side no obvious absces Neck:  supple.   Lungs:  normal respiratory effort and normal breath sounds.   Heart:  normal rate and regular rhythm.   Abdomen:  soft, non-tender, normal bowel sounds, and no distention.   Msk:  normal ROM, no joint tenderness, and no joint swelling.   Extremities:  no cce Neurologic:  alert & oriented X3, cranial nerves II-XII intact, strength normal in all extremities, sensation intact to light touch, sensation intact to pinprick, and DTRs symmetrical and normal.   Skin:  no rashes.   Cervical Nodes:  no anterior cervical adenopathy and no posterior cervical adenopathy.   Psych:  Oriented X3, memory intact for recent and remote, normally interactive, and good eye contact.          Medication Adherence: 02/01/2009   Adherence to medications reviewed with patient. Counseling to provide adequate adherence provided   Prevention For Positives: 02/01/2009   Safe sex practices discussed with patient. Condoms offered.                             Impression & Recommendations:  Problem # 1:  HEADACHE (ICD-784.0) Dr Trecia Rogers dam did w/u 1 wk ago with bw all neg and did course of augmentin.  some relief but HA still present on R side.  Has a history of migraines lasting 2-3 days in past (for the last year or so). Pain only relieved by vicodin currently. Will check CT head and consider LP if needed. Likley migraine if CT neg - rx given for imitrex 50 mg with instructions. Exceddrin as well for now.  Orders: CT with & without  Contrast (CT w&w/o Contrast)  Problem # 2:  HIV INFECTION (ICD-042)  patient appears to controlling his virus well and reconstituting his cd4 now up to 280.  dced his azithromycin at last visit. Continue on bactrim for now until one more repeat over 200. Repeat BW today.    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 Patient Level IV (29562) CT with & without Contrast (CT w&w/o Contrast)  Problem # 3:  PNEUMOCYSTIS PNEUMONIA (ICD-136.3)  cont bactrim ppx  Medications Added to Medication List This Visit: 1)  Sumatriptan Succinate 50 Mg Tabs (Sumatriptan succinate) .... Take one tablet at start of headache and may repeat after 2 hours if no benefit.  may repeat the next day if necessary  Patient Instructions: 1)  Take excedrin over the counter in the day for the headache. 2)  We will get a CT of your Head and call with the results. 3)  Follow up next tuesday (can overbook please). 4)  Bloodwork today.    Hepatitis A Vaccine # 2 (to be given today) Prescriptions: SUMATRIPTAN SUCCINATE 50 MG TABS (SUMATRIPTAN SUCCINATE) Take one tablet at start of headache and may repeat after 2 hours if no benefit.  May repeat the next day if necessary  #10 x 0   Entered and Authorized by:   Clydie Braun MD   Signed by:   Clydie Braun MD on 02/01/2009   Method used:   Print then Give to Patient   RxID:   [id number]  Process Orders Check Orders Results:     [facility]: ABN not required for this insurance Tests Sent for requisitioning (February 01, 2009 10:08 AM):     02/01/2009: [facility] -- T-HIV Viral Load  [id number] (signed)     02/01/2009: [facility] -- [facility] w/Diff [[id number]] (signed)

## 2010-02-26 NOTE — Assessment & Plan Note (Signed)
Summary: STUDY APPT/ LH    Current Allergies: No known allergies  Vital Signs:  Patient profile:   25 year old male Height:      69 inches (175.26 cm) Weight:      136.50 pounds (62.05 kg) BMI:     20.23 Temp:     97.9 degrees F oral Pulse rate:   50 / minute Resp:     16 per minute BP sitting:   130 / 81  (right arm) Is Patient Diabetic? No Pain Assessment Patient in pain? no      Nutritional Status BMI of 19 -24 = normal  Does patient need assistance? Functional Status Self care Ambulation Normal   Patient here for week 48 study visit. He continues to be very adherent and denies any current complaints. He had an episode of SOB a few weeks ago and was seen in an ED out of town. He was told he had a panic attack. He will return to see me in September.Deirdre Evener RN  Jun 20, 2009 9:45 AM   Other Orders: Est. Patient Research Study [id number]) T-Basic Metabolic Panel [phone]) T-Hepatic Function [id number]) T-Lipid Profile [id number]) T-Phosphorus [id number]) T-Urinalysis [phone])  Process Orders Check Orders Results:     [facility]: ABN not required for this insurance Tests Sent for requisitioning (Jun 20, 2009 9:42 AM):     06/20/2009: [facility] -- T-Basic Metabolic Panel [phone] (signed)     06/20/2009: [facility] -- T-Hepatic Function [phone] (signed)     06/20/2009: [facility] -- T-Lipid Profile [phone] (signed)     06/20/2009: [facility] -- T-Phosphorus [[id number]] (signed)     06/20/2009: [facility] -- T-Urinalysis [[id number]] (signed)

## 2010-02-28 NOTE — Assessment & Plan Note (Signed)
Summary: F/U [MKJ]   Primary Provider:  Clydie Braun MD  CC:  pt. c/o low appetite, trouble sleeping, and and headaches.  History of Present Illness: patient is a 25 year old HIV positive male who is currently enrolled in the ACTG study and is taking Prezista, Norvir and Truvada.  His last CD4 count was 521 and his viral load was undetectable.  He presents with several months of headache left-sided.  He had a CT scan done little less than a year ago which was within normal limits except for maxillary sinusitis.  He was treated with Augmentin for sinusitis.  His headaches have continued.  He just complains of difficulty sleeping.  He says he only sleeps about 3 hours a night and wakes up and is unable to go back to sleep.  He also complains of a 20-pound weight loss over several months.  He states he just does not have an appetite.  He denies nausea or vomiting or diarrhea.  He [place] a complaints of night sweats without fever.  Preventive Screening-Counseling & Management  Alcohol-Tobacco     Alcohol drinks/day: 0     Smoking Status: current     Smoking Cessation Counseling: yes     Smoke Cessation Stage: precontemplative     Packs/Day: <0.25  Caffeine-Diet-Exercise     Caffeine use/day: 1 12 oz. can of soda per day     Does Patient Exercise: yes     Type of exercise: walking     Exercise (avg: min/session): 30-60     Times/week: <3  Safety-Violence-Falls     Seat Belt Use: yes      Sexual History:  currently monogamous.        Drug Use:  never.        Blood Transfusions:  no.        Travel History:  no.    Comments: pt. given condoms   Updated Prior Medication List: PROVENTIL HFA 108 (90 BASE) MCG/ACT AERS (ALBUTEROL SULFATE) one puff every 6 hours as needed for shortness of breath TRUVADA 200-300 MG TABS (EMTRICITABINE-TENOFOVIR) 1 by mouth daily PREZISTA 400 MG TABS (DARUNAVIR ETHANOLATE) 2 by mouth daily NORVIR 100 MG TABS (RITONAVIR) 1 by mouth daily with darunavir  and food CLOTRIMAZOLE 1 % CREA (CLOTRIMAZOLE) apply two times a day TRAZODONE HCL 50 MG TABS (TRAZODONE HCL) Take 1 tablet by mouth at bedtime MARINOL 10 MG CAPS (DRONABINOL) one capusle before meals two times a day  Current Allergies (reviewed today): No known allergies  Past History:  Past Medical History: Last updated: 01/24/2009 HIV  GC  CHlamydia Thrush PCP - presumed 4/10 Headache  Review of Systems       The patient complains of anorexia and weight loss.  The patient denies fever, prolonged cough, hemoptysis, abdominal pain, and severe indigestion/heartburn.    Vital Signs:  Patient profile:   25 year old male Height:      69 inches (175.26 cm) Weight:      128.0 pounds (58.18 kg) BMI:     18.97 Temp:     98.1 degrees F (36.72 degrees C) oral Pulse rate:   71 / minute BP sitting:   133 / 78  (right arm)  Vitals Entered By: Wendall Mola CMA Duncan Dull) (February 07, 2010 9:34 AM) CC: pt. c/o low appetite, trouble sleeping, and headaches Is Patient Diabetic? No Pain Assessment Patient in pain? no      Nutritional Status BMI of < 19 = underweight Nutritional Status Detail appetite "  low"  Have you ever been in a relationship where you felt threatened, hurt or afraid?No   Does patient need assistance? Functional Status Self care Ambulation Normal Comments pt. missed one dose of HAART meds.   Physical Exam  General:  alert, well-developed, well-hydrated, and underweight appearing.   Head:  normocephalic and atraumatic.   Eyes:  pupils equal, pupils round, and pupils reactive to light.   Mouth:  pharynx pink and moist.   Lungs:  normal breath sounds.   Abdomen:  soft, non-tender, normal bowel sounds, no masses, no hepatomegaly, and no splenomegaly.     Impression & Recommendations:  Problem # 1:  CONTACT WITH OR EXPOSURE TO VENEREAL DISEASES (ICD-V01.6) re-check RPR Orders: T-RPR (Syphilis) ([id number])  Problem # 2:  WEIGHT LOSS, RECENT  (ICD-783.21) with night sweats will check CXR to look for any kind of lymphoma marinol to stimulate appetite  Problem # 3:  INSOMNIA (ICD-780.52) trial of trazodone  Problem # 4:  HIV INFECTION (ICD-042) continue current meds and f/u with Selena Batten our study nurse Orders: CXR- 2view (CXR) Est. Patient Level III (04540) T-RPR (Syphilis) ([id number])  Diagnostics Reviewed:  HIV: CDC-defined AIDS (06/07/2008)   HIV-Western blot: Positive (04/23/2006)   CD4: 521 (10/08/2009)   WBC: 4.9 (01/24/2009)   Hgb: 13.9 (01/24/2009)   HCT: 39.7 (01/24/2009)   Platelets: 277 (01/24/2009) HIV-1 RNA: 39 (10/08/2009)   HBSAg: NEG (04/23/2006)  Medications Added to Medication List This Visit: 1)  Clotrimazole 1 % Crea (Clotrimazole) .... Apply two times a day 2)  Trazodone Hcl 50 Mg Tabs (Trazodone hcl) .... Take 1 tablet by mouth at bedtime 3)  Marinol 10 Mg Caps (Dronabinol) .... One capusle before meals two times a day Prescriptions: MARINOL 10 MG CAPS (DRONABINOL) one capusle before meals two times a day  #60 x 0   Entered and Authorized by:   Yisroel Ramming MD   Signed by:   Yisroel Ramming MD on 02/07/2010   Method used:   Print then Give to Patient   RxID:   [id number] TRAZODONE HCL 50 MG TABS (TRAZODONE HCL) Take 1 tablet by mouth at bedtime  #30 x 1   Entered and Authorized by:   Yisroel Ramming MD   Signed by:   Yisroel Ramming MD on 02/07/2010   Method used:   Print then Give to Patient   RxID:   [id number] CLOTRIMAZOLE 1 % CREA (CLOTRIMAZOLE) apply two times a day  #30gm x 1   Entered and Authorized by:   Yisroel Ramming MD   Signed by:   Yisroel Ramming MD on 02/07/2010   Method used:   Print then Give to Patient   RxID:   [id number]

## 2010-02-28 NOTE — Progress Notes (Signed)
  Phone Note Call from Patient   Reason for Call: Talk to Nurse Summary of Call: [name] called wanting a referral to [facility] for a bunion on his right foot. He says it has been hurting him and he needs surgery on it. He has medicaid now and the center accepts it. Their phone number is [phone]. If you agree, I can do the referral.[facility] RN  February 13, 2010 9:43 AM Initial call taken by: Deirdre Evener RN,  February 13, 2010 9:43 AM

## 2010-02-28 NOTE — Assessment & Plan Note (Signed)
Summary: STUDY APPT/ LH    Current Allergies: No known allergies  Vital Signs:  Patient profile:   25 year old male Weight:      123.8 pounds (56.27 kg) BMI:     18.35 Temp:     98.2 degrees F oral Pulse rate:   56 / minute Resp:     18 per minute BP sitting:   126 / 84  (right arm) Is Patient Diabetic? No Pain Assessment Patient in pain? no      Nutritional Status BMI of < 19 = underweight  Does patient need assistance? Functional Status Self care Ambulation Normal   Patient here for week 80 study visit. He has lost almost 20 lbs since last year and is concerned about it. He says he is eating okay. He is also c/o hacking cough, needing to clear his throat frequently ? allergies, and having trouble sleeping with frequent night sweats. He has not been seen by a physician since Dr. Sampson Goon left and an appt was made for Dr. Philipp Deputy next week.Deirdre Evener RN  January 29, 2010 10:27 AM   Other Orders: Est. Patient Research Study [id number]) T-Basic Metabolic Panel [id number]) T-Hepatic Function [id number]) T-Phosphorus [id number])

## 2010-03-20 NOTE — Miscellaneous (Signed)
Summary: HIV-1 RNA, CD4 (RESEARCH)  Clinical Lists Changes  Observations: Added new observation of CD4 COUNT: 568 microliters (01/29/2010 10:00) Added new observation of HIV1RNA QA: 39 copies/mL (01/29/2010 10:00)

## 2010-05-06 LAB — CULTURE, ROUTINE-ABSCESS

## 2010-05-08 LAB — DIFFERENTIAL
Basophils Absolute: 0 10*3/uL (ref 0.0–0.1)
Basophils Relative: 0 % (ref 0–1)
Basophils Relative: 1 % (ref 0–1)
Eosinophils Absolute: 0 10*3/uL (ref 0.0–0.7)
Eosinophils Relative: 0 % (ref 0–5)
Lymphocytes Relative: 14 % (ref 12–46)
Monocytes Absolute: 0.6 10*3/uL (ref 0.1–1.0)
Monocytes Relative: 11 % (ref 3–12)
Neutro Abs: 3.1 10*3/uL (ref 1.7–7.7)

## 2010-05-08 LAB — CBC
HCT: 31.8 % — ABNORMAL LOW (ref 39.0–52.0)
HCT: 36.8 % — ABNORMAL LOW (ref 39.0–52.0)
Hemoglobin: 10.3 g/dL — ABNORMAL LOW (ref 13.0–17.0)
Hemoglobin: 9.8 g/dL — ABNORMAL LOW (ref 13.0–17.0)
Hemoglobin: 11.9 g/dL — ABNORMAL LOW (ref 13.0–17.0)
MCHC: 32.7 g/dL (ref 30.0–36.0)
MCHC: 32.9 g/dL (ref 30.0–36.0)
MCHC: 33.2 g/dL (ref 30.0–36.0)
MCHC: 33.7 g/dL (ref 30.0–36.0)
MCHC: 33.1 g/dL (ref 30.0–36.0)
MCV: 81.7 fL (ref 78.0–100.0)
MCV: 82.3 fL (ref 78.0–100.0)
MCV: 82.5 fL (ref 78.0–100.0)
MCV: 82.1 fL (ref 78.0–100.0)
Platelets: 301 10*3/uL (ref 150–400)
RBC: 3.64 MIL/uL — ABNORMAL LOW (ref 4.22–5.81)
RBC: 3.76 MIL/uL — ABNORMAL LOW (ref 4.22–5.81)
RBC: 3.86 MIL/uL — ABNORMAL LOW (ref 4.22–5.81)
RBC: 3.91 MIL/uL — ABNORMAL LOW (ref 4.22–5.81)
RBC: 4.51 MIL/uL (ref 4.22–5.81)
RBC: 4.38 MIL/uL (ref 4.22–5.81)
WBC: 10 10*3/uL (ref 4.0–10.5)
WBC: 11.6 10*3/uL — ABNORMAL HIGH (ref 4.0–10.5)
WBC: 11.8 10*3/uL — ABNORMAL HIGH (ref 4.0–10.5)
WBC: 6.6 10*3/uL (ref 4.0–10.5)
WBC: 9.1 10*3/uL (ref 4.0–10.5)

## 2010-05-08 LAB — HEPATIC FUNCTION PANEL
ALT: 16 U/L (ref 0–53)
AST: 29 U/L (ref 0–37)
Bilirubin, Direct: 0.1 mg/dL (ref 0.0–0.3)
Total Bilirubin: 0.4 mg/dL (ref 0.3–1.2)

## 2010-05-08 LAB — GC/CHLAMYDIA PROBE AMP, URINE
Chlamydia, Swab/Urine, PCR: POSITIVE — AB
GC Probe Amp, Urine: NEGATIVE

## 2010-05-08 LAB — COMPREHENSIVE METABOLIC PANEL
AST: 40 U/L — ABNORMAL HIGH (ref 0–37)
Alkaline Phosphatase: 72 U/L (ref 39–117)
CO2: 24 mEq/L (ref 19–32)
Chloride: 98 mEq/L (ref 96–112)
Creatinine, Ser: 0.94 mg/dL (ref 0.4–1.5)
GFR calc Af Amer: >60 mL/min (ref >60)
GFR calc non Af Amer: >60 mL/min (ref >60)
Potassium: 4.1 mEq/L (ref 3.5–5.1)
Total Bilirubin: 0.6 mg/dL (ref 0.3–1.2)

## 2010-05-08 LAB — T-HELPER CELLS (CD4) COUNT (NOT AT ARMC)
CD4 % Helper T Cell: 1 % — ABNORMAL LOW (ref 33–55)
CD4 T Cell Abs: 60 uL — ABNORMAL LOW (ref 400–2700)

## 2010-05-08 LAB — POCT I-STAT, CHEM 8
BUN: 16 mg/dL (ref 6–23)
Calcium, Ion: 1.08 mmol/L — ABNORMAL LOW (ref 1.12–1.32)
Chloride: 103 mEq/L (ref 96–112)
Creatinine, Ser: 1 mg/dL (ref 0.4–1.5)
Glucose, Bld: 93 mg/dL (ref 70–99)
TCO2: 23 mmol/L (ref 0–100)

## 2010-05-08 LAB — BASIC METABOLIC PANEL
BUN: 3 mg/dL — ABNORMAL LOW (ref 6–23)
BUN: 3 mg/dL — ABNORMAL LOW (ref 6–23)
CO2: 21 mEq/L (ref 19–32)
CO2: 25 mEq/L (ref 19–32)
CO2: 25 mEq/L (ref 19–32)
CO2: 30 mEq/L (ref 19–32)
Calcium: 7.8 mg/dL — ABNORMAL LOW (ref 8.4–10.5)
Calcium: 8.4 mg/dL (ref 8.4–10.5)
Calcium: 8.8 mg/dL (ref 8.4–10.5)
Calcium: 9.1 mg/dL (ref 8.4–10.5)
Calcium: 9.3 mg/dL (ref 8.4–10.5)
Chloride: 103 mEq/L (ref 96–112)
Chloride: 105 mEq/L (ref 96–112)
Chloride: 108 mEq/L (ref 96–112)
Chloride: 103 mEq/L (ref 96–112)
Creatinine, Ser: 0.6 mg/dL (ref 0.4–1.5)
Creatinine, Ser: 0.64 mg/dL (ref 0.4–1.5)
Creatinine, Ser: 0.69 mg/dL (ref 0.4–1.5)
Creatinine, Ser: 0.71 mg/dL (ref 0.4–1.5)
GFR calc Af Amer: >60 mL/min (ref >60)
GFR calc Af Amer: >60 mL/min (ref >60)
GFR calc Af Amer: >60 mL/min (ref >60)
GFR calc Af Amer: >60 mL/min (ref >60)
GFR calc non Af Amer: >60 mL/min (ref >60)
GFR calc non Af Amer: >60 mL/min (ref >60)
Potassium: 4.1 mEq/L (ref 3.5–5.1)
Potassium: 4 mEq/L (ref 3.5–5.1)
Sodium: 134 mEq/L — ABNORMAL LOW (ref 135–145)
Sodium: 141 mEq/L (ref 135–145)
Sodium: 139 mEq/L (ref 135–145)

## 2010-05-08 LAB — CULTURE, RESPIRATORY W GRAM STAIN: Culture: NORMAL

## 2010-05-08 LAB — BLOOD GAS, ARTERIAL
Acid-base deficit: 1.9 mmol/L (ref 0.0–2.0)
Bicarbonate: 19.8 mEq/L — ABNORMAL LOW (ref 20.0–24.0)
Bicarbonate: 21.9 mEq/L (ref 20.0–24.0)
FIO2: 0.21 %
O2 Saturation: 97.6 %
Patient temperature: 98.6
TCO2: 18.4 mmol/L (ref 0–100)
pCO2 arterial: 32.4 mmHg — ABNORMAL LOW (ref 35.0–45.0)
pCO2 arterial: 35.5 mmHg (ref 35.0–45.0)
pH, Arterial: 7.403 (ref 7.350–7.450)
pO2, Arterial: 97 mmHg (ref 80.0–100.0)

## 2010-05-08 LAB — URINE CULTURE
Colony Count: NO GROWTH
Culture: NO GROWTH

## 2010-05-08 LAB — URINALYSIS, ROUTINE W REFLEX MICROSCOPIC
Hgb urine dipstick: NEGATIVE
Ketones, ur: 15 mg/dL — AB
Protein, ur: 30 mg/dL — AB
Urobilinogen, UA: 2 mg/dL — ABNORMAL HIGH (ref 0.0–1.0)

## 2010-05-08 LAB — HIV-1 GENOTYPR PLUS: Resistance Assoc RT Mutations: NOT DETECTED

## 2010-05-08 LAB — APTT: aPTT: 42 seconds — ABNORMAL HIGH (ref 24–37)

## 2010-05-08 LAB — HEPATITIS B SURFACE ANTIGEN: Hepatitis B Surface Ag: NEGATIVE

## 2010-05-08 LAB — CULTURE, BLOOD (ROUTINE X 2)

## 2010-05-08 LAB — URINE MICROSCOPIC-ADD ON

## 2010-05-08 LAB — RPR: RPR Ser Ql: NONREACTIVE

## 2010-05-08 LAB — CRYPTOCOCCAL ANTIGEN: Crypto Ag: NEGATIVE

## 2010-05-08 LAB — LACTATE DEHYDROGENASE: LDH: 401 U/L — ABNORMAL HIGH (ref 94–250)

## 2010-05-08 LAB — HEPATITIS A ANTIBODY, TOTAL: Hep A Total Ab: NEGATIVE

## 2010-05-13 LAB — URINALYSIS, ROUTINE W REFLEX MICROSCOPIC
Bilirubin Urine: NEGATIVE
Glucose, UA: NEGATIVE mg/dL
Hgb urine dipstick: NEGATIVE
Specific Gravity, Urine: 1.034 — ABNORMAL HIGH (ref 1.005–1.030)
pH: 6.5 (ref 5.0–8.0)

## 2010-05-13 LAB — URINE MICROSCOPIC-ADD ON

## 2010-05-13 LAB — GC/CHLAMYDIA PROBE AMP, GENITAL: GC Probe Amp, Genital: POSITIVE — AB

## 2010-05-13 LAB — URINE CULTURE: Colony Count: NO GROWTH

## 2010-05-20 ENCOUNTER — Encounter: Payer: Self-pay | Admitting: *Deleted

## 2010-05-20 ENCOUNTER — Ambulatory Visit (INDEPENDENT_AMBULATORY_CARE_PROVIDER_SITE_OTHER): Payer: Self-pay | Admitting: *Deleted

## 2010-05-20 VITALS — BP 130/88 | HR 58 | Temp 98.3°F | Resp 20 | Ht 69.0 in | Wt 129.8 lb

## 2010-05-20 DIAGNOSIS — B20 Human immunodeficiency virus [HIV] disease: Secondary | ICD-10-CM

## 2010-05-20 LAB — HEPATIC FUNCTION PANEL
ALT: 18 U/L (ref 0–53)
AST: 20 U/L (ref 0–37)
Bilirubin, Direct: 0.1 mg/dL (ref 0.0–0.3)
Indirect Bilirubin: 0.4 mg/dL (ref 0.0–0.9)
Total Bilirubin: 0.5 mg/dL (ref 0.3–1.2)

## 2010-05-20 LAB — BASIC METABOLIC PANEL
BUN: 7 mg/dL (ref 6–23)
Calcium: 10 mg/dL (ref 8.4–10.5)
Glucose, Bld: 85 mg/dL (ref 70–99)

## 2010-05-20 LAB — LIPID PANEL: Cholesterol: 116 mg/dL (ref 0–200)

## 2010-05-20 NOTE — Progress Notes (Signed)
Patient here for week 96 study visit. He has been very adherent with meds and has had no problems with tolerating them. He is currently c/o pain in his rt big toe and wants a referral for a foot doctor. He also is c/o itchy rash between his toes. I told him to try some tinactin powder on his feet for that to see if it would help. He still complains of occasional headache, night sweats and coughing up a lot of phlegm. He is instructed to make an appt with one of the ID providers for followup.

## 2010-05-21 LAB — URINALYSIS, ROUTINE W REFLEX MICROSCOPIC
Glucose, UA: NEGATIVE mg/dL
Hgb urine dipstick: NEGATIVE
Ketones, ur: NEGATIVE mg/dL
Leukocytes, UA: NEGATIVE
Specific Gravity, Urine: 1.016 (ref 1.005–1.030)
pH: 7 (ref 5.0–8.0)

## 2010-05-30 ENCOUNTER — Encounter: Payer: Self-pay | Admitting: Adult Health

## 2010-05-30 LAB — CD4/CD8 (T-HELPER/T-SUPPRESSOR CELL)
CD4%: 18.9
CD8 % Suppressor T Cell: 59.2
CD8: 1480

## 2010-06-04 ENCOUNTER — Ambulatory Visit (INDEPENDENT_AMBULATORY_CARE_PROVIDER_SITE_OTHER): Payer: Medicaid Other | Admitting: Adult Health

## 2010-06-04 ENCOUNTER — Encounter: Payer: Self-pay | Admitting: Adult Health

## 2010-06-04 VITALS — BP 134/83 | HR 58 | Temp 97.8°F | Ht 69.0 in | Wt 131.5 lb

## 2010-06-04 DIAGNOSIS — B353 Tinea pedis: Secondary | ICD-10-CM

## 2010-06-04 DIAGNOSIS — B20 Human immunodeficiency virus [HIV] disease: Secondary | ICD-10-CM

## 2010-06-04 MED ORDER — FLUCONAZOLE 100 MG PO TABS: 100.0000 mg | ORAL_TABLET | Freq: Every day | ORAL | Status: AC

## 2010-06-04 MED ORDER — KETOCONAZOLE 2 % EX CREA: TOPICAL_CREAM | Freq: Two times a day (BID) | CUTANEOUS | Status: AC

## 2010-06-04 NOTE — Progress Notes (Signed)
  Subjective:    Patient ID: [name], male    DOB: 1985-01-29, 25 y.o.   MRN: [id number]  HPI presents for followup. Remains adherent with his medications with good tolerance. Has a chief complaint of sores, and pain to both feet, particularly between the toes and on the left great toe pain, with some swelling, and a "blister." States this has been going on for approximately one month in the pain appears to be worsening with associated pruritus.    Review of Systems  Constitutional: Negative.   HENT: Negative.   Eyes: Negative.   Respiratory: Negative.   Cardiovascular: Negative.   Gastrointestinal: Negative.   Genitourinary: Negative.   Musculoskeletal: Positive for gait problem.       As per history of present illness  Skin: Positive for wound.       As per history of present illness  Neurological: Negative.   Hematological: Negative.   Psychiatric/Behavioral: Negative.        Objective:   Physical Exam  Constitutional: He is oriented to person, place, and time. He appears well-developed and well-nourished.  HENT:  Head: Normocephalic and atraumatic.  Eyes: Conjunctivae and EOM are normal. Pupils are equal, round, and reactive to light.  Neck: Normal range of motion. Neck supple.  Cardiovascular: Normal rate and regular rhythm.   Pulmonary/Chest: Effort normal and breath sounds normal.  Abdominal: Soft. Bowel sounds are normal.  Musculoskeletal: Normal range of motion. He exhibits edema and tenderness.  Neurological: He is alert and oriented to person, place, and time. No cranial nerve deficit. Coordination normal.  Skin: Skin is warm and dry. Rash noted.       White moist hyperkeratotic eyes, tissue noted in the inter-webbing spaces between the digits of both feet with some fissure noted. Left great toe demonstrates edema with white encrusting on the nailbed and blister formation noted on the lateral side of the nailbed.  Psychiatric: He has a normal mood and affect.  His behavior is normal. Judgment and thought content normal.          Assessment & Plan:  1. HIV. CD4 count from 05/20/2010 was 473 at 18.9% with a viral load of less than 40 copies per mL. Appears clinically stable on current regimen. Recommend continuing present management with repeat labs as per research protocol. Verbally acknowledged and agreed with plan of care.  2. Tinea Pedis. Appears relatively extensive. Foot soaks with antibacterial soap daily. Meticulous foot hygiene. Cotton socks (may need to change frequently throughout the day.). Leave socks off at bedtime. We'll give fluconazole 100 mg by mouth daily for 7 days. Ketoconazole 2% cream to apply to effected areas twice a day. To return in approximately 10 days for reevaluation. If blistering worsens, he is to contact the clinic and we will begin empiric antibiotic treatment. Verbally acknowledged and agreed with plan of care.

## 2010-06-11 NOTE — H&P (Signed)
[name], [name]             ACCOUNT NO.:  [id number]   MEDICAL RECORD NO.:  [id number]          PATIENT TYPE:  INP   LOCATION:  1422                         FACILITY:  [facility]   PHYSICIAN:  [name], MD         DATE OF BIRTH:  [birth year]   DATE OF ADMISSION:  05/18/2008  DATE OF DISCHARGE:                              HISTORY & PHYSICAL   PRIMARY CARE PHYSICIAN:  Unassigned.   CHIEF COMPLAINT:  Difficulty breathing.   HISTORY OF PRESENT ILLNESS:  [name] is a 25 year old Cunningham with  no significant past medical history who reported shortness of breath  approximately 7 days ago.  Over the last 7 days it has progressively  gotten worse and has been associated with shaking chills, drenching  night sweats and left-sided chest pain.  The patient reports a cough  that is productive of green sputum but denies any hemoptysis.  He  reports cough comes in fits and when Troy occurs, he knows tachycardia  and worsening shortness of breath.  He denies any difficulty swallowing  but does report anorexia over Troy time frame and feels that he has lost  a significant amount of weight.  He denies any diarrhea until last night  when he had one diarrhea stool.  He has not noticed any rashes or  painful areas on his body.  He recently noted a sore throat, darkness of  the urine but denies any dysuria or urinary frequency.  The patient  presented to the emergency department approximately 26 days ago for  nausea and vomiting and was treated conservatively and was  symptomatically better, but of note he was found to have white plaques  on his tongue and was diagnosed with thrush.  In addition, the patient  reports that he is homosexual and states that he has had HIV testing in  February and March, both of which were negative.   PAST MEDICAL HISTORY:  The patient denies any past medical history.   MEDICATIONS:  The patient denies taking any medications.   ALLERGIES:  The patient had no  known allergies.   FAMILY HISTORY:  The patient denies any significant family history.   SOCIAL HISTORY:  The [place] mother.  He is unemployed and did not complete high school but does  have a GED.  He smokes a third of a pack of cigarettes a day and has  done so the last 5 years.  He drinks alcohol on occasion and denies any  illicit drug use, specifically intravenous drug use.   REVIEW OF SYSTEMS:  Complete review of systems was performed, was  negative unless noted in history of present illness.   PHYSICAL EXAMINATION:  VITAL SIGNS:  Temperature 98.1, blood pressure  141/86, pulse 134, respiratory 22, oxygen saturation 93% on room air,  but dropping to 87% on room air with activity. Oxygen saturation 100% on  2 liters nasal cannula.  GENERAL:  Troy an [place] American male in no  acute distress.  HEENT: Patient is normocephalic, atraumatic.  Extraocular movements are  intact.  Eyes are sunken.  Nares are patent.  Oropharynx is dry with  mucous membranes.  There is a white plaque on his tongue.  Nares are  patent bilaterally.  Hearing is grossly intact.  NECK:  Supple without lymphadenopathy or supraclavicular wasting.  LUNGS:  Clear to auscultation bilaterally without wheezes or rhonchi.  HEART:  Regular rate and rhythm, tachycardiac.  S1 and S2 were normal.  ABDOMEN:  Abdomen is scaphoid, nontender, nondistended.  Bowel sounds  are active.  NEUROLOGIC:  Cranial nerves II-XII are intact.  Muscle strength is  symmetric.  Sensation is grossly normal.  SKIN:  No rashes or lesions  are noted.  MUSCULOSKELETAL:  No bony abnormalities, muscle tenderness or active  synovitis.  MENTAL STATUS:  Patient is alert and oriented x4.  Memory is intact to  recent distant events.  Mood is appropriate.  Affect is full.   LABORATORY DATA:  CBC shows white count 11.6, hemoglobin 12.1, platelet  count 278,000.  CMP shows sodium 134, potassium 4.1,  chloride 98, bicarb  24, glucose 95, BUN 15, creatinine 0.94.  Total bili 0.6, alk phos 72,  AST 40, ALT 15, total protein 9, albumin 3.4, calcium 9.1, LDH 401.  Urinalysis shows a specific gravity of 1.038, negative for blood,  negative for nitrites, negative for leukocytes.  Arterial blood gas on  room air shows a pH of 7.403, pCO2 of 32.4, pO2 of 61.9.   RADIOLOGY:  Chest x-ray shows bibasilar air space disease most  consistent with pneumonia, including atypical pneumonias.  Viral  infection should also be considered with possibility of secondary  bacterial infection.   ASSESSMENT/PLAN:  Troy is a 25 year old Cunningham with no specific past  medical history who presents with symptoms consistent with mucoid  pneumonia complicated by the fact that he has HIV risk factors, has an  elevated LDH, but appears to be paraproteinemia, and thrush concerning  for HIV infection.  1. Hypoxic respiratory failure secondary to the bacterial pneumonia.      At Troy point, the patient's oxygen saturation has improved nicely      on 2 liters nasal cannula.  Will continue the patient on oxygen and      treat the  underlying respiratory failure.  2. Pneumonia.  The patient has no risk factors for multi-drug      resistant bacteria; therefore, will initiate treatment with for      community-acquired pneumonia with azithromycin and Rocephin.  That      being said, there is an overwhelming concern that [name]      with HIV risk factors indeed has HIV based on his laboratory data      presently.  Troy would raise concern for Pneumocystis pneumonia.      With the patient reporting a recent HIV test that was negative, it      is hard to imagine that the patient would have acute infection      causing a low enough CD-4 count to put him at risk of developing      Pneumocystis.  That being said, he does have thrush and appears      quite wasted.  At Troy time will continue treatment with       azithromycin and Rocephin for community-acquired pneumonia.  Will      send a CD-4 count, HIV antibody, HIV viral load right away and      obtain sputum culture, blood culture and induce sputum for  Pneumocystis direct fluorescent antibody.  If the patient shows      worsening  clinically,  will initiate Bactrim and steroid for      presumed PCP pneumonia.  3. Malnutrition and dehydration.  The patient's albumin is less than 3      and his specific gravity on the urine greater than 1.03.  Will      grossly hydrate the patient on normal saline 150 mL an hour.  I am      presuming the patient has anorexia secondary to inflammatory      process that should improve with treatment for his pneumonia.  Will      monitor prealbumins weekly and consider more aggressive      interventions if his appetite does not improve.  4. Prophylaxis.  Lovenox 40 mg subcu daily.  5. The patient is a full code.  6. Thrush.  Will initiate the patient on Diflucan 100 mg p.o. daily.      [name], MD  Electronically Signed     TJ/MEDQ  D:  05/18/2008  T:  05/18/2008  Job:  [id number]

## 2010-06-11 NOTE — Discharge Summary (Signed)
[name], [name]             ACCOUNT NO.:  [id number]   MEDICAL RECORD NO.:  [id number]          PATIENT TYPE:  INP   LOCATION:  1422                         FACILITY:  [facility]   PHYSICIAN:  [name], M.D.      DATE OF BIRTH:  02-07-1985   DATE OF ADMISSION:  05/18/2008  DATE OF DISCHARGE:  05/22/2008                               DISCHARGE SUMMARY   PRIMARY CARE PHYSICIAN:  The patient is unassigned.   DISCHARGE DIAGNOSES:  1. 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  2. 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  3. Oral thrush.  4. Malnutrition.  5. Anemia of chronic disease.   The patient's discharge medications are  1. Septra Double Strength 1 tablet p.o. t.i.d.  2. Diflucan 100 mg p.o. daily.  3. Zithromax 500 mg p.o. daily.  4. Prednisone taper.   DISPOSITION:  The patient will be discharged to follow up at ID clinic.  He has been instructed on what he needs to do.  He has been diagnosed  with HIV and hopefully he will get a HRT treatment as an outpatient.   PROCEDURES PERFORMED:  Chest x-ray performed on May 18, 2008 that  shows bilateral pneumonia consistent with atypical pneumonia.   CONSULTATIONS:  Dr. Sampson Goon, infectious disease.   BRIEF HISTORY AND PHYSICAL:  Please refer to dictated history and  physical on admission by Dr. [name]. In short however, this a 25-  year-old gentleman who presented with a 7-day history of shortness of  breath associated with some productive green sputum.  No hemoptysis.  No  chest pain.  The patient was found to be relatively hypoxic on room air  with a sat of 87%.  Chest x-ray above showed bilateral pneumonia.  The  patient was subsequently admitted with hypoxic respiratory failure.  Part of what was observed was the patient was slightly cachectic and he  reported weight  loss.  He also seemed to be malnourished with his  albumin borderline of 3.4.  He was subsequently admitted for further  medical workup.   HOSPITAL COURSE:  1. Bilateral pneumonia.  This was worked up effectively. With increase      in his LDH and other factors it was presumed that the patient has      probably PCP pneumonia.  This was arriving also after the patient      had his HIV serology confirming that he was HIV positive with a CD-      4 count of only 60.  Subsequently ID was consulted and the patient      had steroid taper added.  2. HIV/AIDS.  The patient denied being HIV positive on admission.      However, after ID consult and review of clinical records it      appeared that the patient was actually diagnosed 2 years ago but      refused to follow up in the clinic.  He has been counseled now. He      was seen by Dr. Sampson Goon, and he agreed to follow up with him as      an outpatient.  3. Malnutrition. The patient has been encouraged take p.o. This is      probably HIV related.  4. Oral thrush.  Again this is most likely HIV related.  He has been      on IV Diflucan and now converted to oral Diflucan.  5. Anemia of chronic disease.  Also HIV related most likely.  His H      and H have been stable with hemoglobin of around 10.6.   DISCHARGE LABS:  White count 11.8, hemoglobin 10.6, platelet count  clumps.  Sodium 139, potassium 4.6, chloride 107, CO2 of 25, BUN 7,  creatinine 0.69 and glucose 104, calcium is 9.1.      [name], M.D.  Electronically Signed     LG/MEDQ  D:  05/22/2008  T:  05/22/2008  Job:  [id number]   cc:   Mick Sell, MD

## 2010-06-14 ENCOUNTER — Other Ambulatory Visit: Payer: Self-pay | Admitting: *Deleted

## 2010-06-14 DIAGNOSIS — B20 Human immunodeficiency virus [HIV] disease: Secondary | ICD-10-CM

## 2010-06-14 MED ORDER — RITONAVIR 100 MG PO CAPS: 100.0000 mg | ORAL_CAPSULE | Freq: Every day | ORAL | Status: DC

## 2010-06-14 NOTE — Op Note (Signed)
NAMELOVELACE, CERVENY             ACCOUNT NO.:  [id number]   MEDICAL RECORD NO.:  [id number]          PATIENT TYPE:  AMB   LOCATION:  SDS                          FACILITY:  [facility]   PHYSICIAN:  Nadara Mustard, MD     DATE OF BIRTH:  [birth year]   DATE OF PROCEDURE:  07/10/2005  DATE OF DISCHARGE:                                 OPERATIVE REPORT   PREOPERATIVE DIAGNOSIS:  Flexor tendon laceration, left ring and left little  finger.   POSTOPERATIVE DIAGNOSES:  Flexor digitorum superficialis and flexor  digitorum profundus laceration, left ring and left little finger, zone II.   PROCEDURE:  1.  Repair of flexor digitorum superficialis and flexor digitorum profundus,      left little finger zone II.  2.  Repair of flexor digitorum superficialis and flexor digitorum profundus,      left ring finger, zone II.   SURGEON:  Nadara Mustard, M.D.   ANESTHESIA:  General.   ESTIMATED BLOOD LOSS:  Minimal.   ANTIBIOTICS:  1 gram of Kefzol.   DRAINS:  None.   COMPLICATIONS:  None.   Esmarch at the wrist for approximately 10 minutes.   DISPOSITION:  To PACU in stable condition.   INDICATIONS FOR PROCEDURE:  The patient is a 25 year old gentleman who  states he was cleaning a knife when he cut his hand, zone II left ring and  little finger.  This was initially seen in the emergency room. This was  cleansed.  The skin was sutured. The patient received antibiotics and the  patient presents at this time for evaluation and treatment of the zone II  injuries.  The patient does not have active flexion of the PIP or DIP joints  of the little or ring finger.  His sensation is grossly intact.  Radiograph  shows no evidence of a fracture.   ASSESSMENT:  Laceration flexor digitorum superficialis and flexor digitorum  profundus, zone II, left ring and little finger.   PLAN:  The patient presents for repair of the flexor tendons.  Risks and  benefits were discussed including infection,  neurovascular injury,  stiffness, loss of motion, need for additional surgery.  The patient states  he understands and wishes to proceed at this time.   DESCRIPTION OF PROCEDURE:  The patient was brought to OR room #10 and  underwent a general anesthetic.  After an adequate level of anesthesia was  obtained, the patient's left upper extremity was prepped using DuraPrep and  draped in a sterile field.  Initially, an Esmarch was wrapped around the  wrist for tourniquet control.  Both oblique incisions in zone II were  extended in an extensile fashion in a Z pattern proximally and distally.  Examination showed intact A2 pulleys of both the ring and little finger.  The tendons were cut just proximal to the A2 pulley.  The FDS and FDP were  then, using a Kessler technique, were sutured distally and proximally with  #3-0 FiberWire.  The FDS and FDP were placed under the A2 pulley of the ring  and little finger and then were sutured  proximal to the A2 pulley for the  ring and little finger.  With extension, the repair extended beneath the A2  pulley.  Examination with repair of the FDS and FDP using Kessler technique  with four strands across each tendon, 3-0 FiberWire, the patient had  restoration of the cascade of his fingers with his fingers relaxed.  The  Esmarch was released after only 10 minutes due to binding up of the flexor  tendons with using the Esmarch.  The procedure was performed without using a  tourniquet.  After the tendons were repaired, the wounds were irrigated with  normal saline and the skin was closed using 3-0 nylon.  The wounds were  covered with Adaptic orthopedic sponges, sterile Webril and a Coban bulky  dressing.  The patient's wrist was left flexed with the fingers flexed in a  resting position.  The MCP joint was extended.  The patient was extubated,  taken to PACU in stable condition.  Plan for a 23 observation.  We will  follow up in the office and start him on  flexor tendon protocol with active  extension and rubber band flexion to control the fingers.      Nadara Mustard, MD  Electronically Signed     MVD/MEDQ  D:  07/10/2005  T:  07/10/2005  Job:  [id number]

## 2010-06-14 NOTE — Discharge Summary (Signed)
NAMEEVART, MCDONNELL             ACCOUNT NO.:  [id number]   MEDICAL RECORD NO.:  [id number]          PATIENT TYPE:  OBV   LOCATION:  2029                         FACILITY:  [facility]   PHYSICIAN:  Dani Gobble, MD       DATE OF BIRTH:  1985-05-25   DATE OF ADMISSION:  04/03/2006  DATE OF DISCHARGE:  04/04/2006                               DISCHARGE SUMMARY   Mr. Klumpp is a 25 year old, African-American male who presented to the  [facility] ER after experiencing a questionable syncopal episode.  He  was having a dental procedure and became unresponsive.  He was seen in  the emergency room and DC home.  His EKG was reviewed and an abnormality  noted.  He returned on the day of admission for further evaluation.  He  has had chest pain apparently one year, progressively worse, had mild  chest pain yesterday with shortness of breath.  It occurs after meals  and it lasts for 45 minutes and abates spontaneously without any  intervention.  He also complained about cough.  He was seen by Dr. Kem Boroughs and admitted for observation.  His EKG yesterday showed normal  sinus rhythm with an ST elevation up in V2-V3 with biphasic ST in V4,  positive LVH.  On the day of admission his EKG showed normal sinus  rhythm with sinus arrhythmia, right axis deviation, T-wave inversion in  V3, and nonspecific ST abnormalities in V4-V6.  He underwent a chest CT  that showed no pulmonary emboli.  He underwent a D-dimer which was  negative.  Some of his other labs were pending.  His liver enzymes were  normal.  His HIV did show reactive and it has been sent for further  analysis.  He was seen by Dr. Domingo Sep on April 04, 2006 and considered  ready for discharge.  She had noted a urine culture, but, however, it  was from January.  He was treated in January for Group B strep, thus, he  was not sent home on any more antibiotic.  He did have a gram stain  which was negative.  He was told at the time of discharge  that he would  have to follow up with his primary care doctor for the results of his  laboratory tests.  His labs showed a magnesium of 2.2, total cholesterol  114, total triglycerides 79, HDL 33, LDL 65. His sodium was 137,  potassium 3.8, BUN 8, creatinine 0.8, glucose is 135.  AST was 24, ALT  was 15, total protein was 6.9, albumin 3.4.  ESR was 1.  D-dimer was  0.24.  Also he had 2D echo.  It was unremarkable per Dr. Domingo Sep.  HIV  was reactive.  Has been sent for Western blot.   DISCHARGE MEDICATIONS:  1. Naprosyn 500 mg twice a day for 10 days.  2. Prilosec 20 mg a day.   DISCHARGE DIAGNOSES:  1. Questionable syncopal episode.  2. Chest pain, not ischemic related.  3. Cough.  4. Abnormal lab tests.  Needs to be followed up.      Meriam Sprague  Delanna Ahmadi, N.P.    ______________________________  Dani Gobble, MD    BB/MEDQ  D:  04/04/2006  T:  04/05/2006  Job:  [id number]   cc:   Dani Gobble, MD

## 2010-06-18 ENCOUNTER — Ambulatory Visit (INDEPENDENT_AMBULATORY_CARE_PROVIDER_SITE_OTHER): Payer: Medicaid Other | Admitting: Adult Health

## 2010-06-18 VITALS — BP 150/89 | HR 67 | Temp 98.2°F | Ht 69.0 in | Wt 126.8 lb

## 2010-06-18 DIAGNOSIS — B353 Tinea pedis: Secondary | ICD-10-CM

## 2010-06-18 MED ORDER — FLUCONAZOLE 100 MG PO TABS: 100.0000 mg | ORAL_TABLET | Freq: Every day | ORAL | Status: AC

## 2010-06-18 NOTE — Progress Notes (Signed)
  Subjective:    Patient ID: [name], male    DOB: 26-Nov-1985, 26 y.o.   MRN: [id number]  HPI  Presents for a 10 day checkup after starting treatment for relatively extensive tinea pedis. Claims feet are remarkably better with with strain dissipating rapidly. No new lesions reported  Review of Systems As per history of present illness    Objective:   Physical Exam Remarkable improvement to the digits of both feet. Fissures have cleared. However, there still some scaling in the crevices between the digits. Blistering good about almost completely dissipated       Assessment & Plan:  1. Tinea Pedis. We'll give additional 5 days of fluconazole and have him continue treating topically with ketoconazole cream. Followup when necessary and as per plans with research.

## 2010-07-15 ENCOUNTER — Other Ambulatory Visit: Payer: Self-pay | Admitting: Adult Health

## 2010-08-07 ENCOUNTER — Ambulatory Visit
Admission: RE | Admit: 2010-08-07 | Discharge: 2010-08-07 | Disposition: A | Discharge status: Home / Self Care | Payer: Medicaid Other | Source: Ambulatory Visit | Attending: Adult Health | Admitting: Adult Health

## 2010-08-07 ENCOUNTER — Encounter: Payer: Self-pay | Admitting: Adult Health

## 2010-08-07 ENCOUNTER — Telehealth: Payer: Self-pay | Admitting: *Deleted

## 2010-08-07 ENCOUNTER — Other Ambulatory Visit: Payer: Self-pay | Admitting: Adult Health

## 2010-08-07 ENCOUNTER — Ambulatory Visit (INDEPENDENT_AMBULATORY_CARE_PROVIDER_SITE_OTHER): Payer: Medicaid Other | Admitting: Adult Health

## 2010-08-07 VITALS — BP 144/79 | HR 79 | Temp 98.7°F | Ht 69.0 in | Wt 122.0 lb

## 2010-08-07 DIAGNOSIS — M79642 Pain in left hand: Secondary | ICD-10-CM

## 2010-08-07 DIAGNOSIS — M79609 Pain in unspecified limb: Secondary | ICD-10-CM

## 2010-08-07 MED ORDER — TRAMADOL HCL 50 MG PO TABS: 100.0000 mg | ORAL_TABLET | Freq: Three times a day (TID) | ORAL | Status: DC | PRN

## 2010-08-07 MED ORDER — IBUPROFEN 600 MG PO TABS: 600.0000 mg | ORAL_TABLET | Freq: Three times a day (TID) | ORAL | Status: AC | PRN

## 2010-08-07 NOTE — Telephone Encounter (Signed)
Patient called and said he has reinjured his hand. Apparently he had cut it some years ago and messed up a nerve in it and had it operated on. He fell on it recently and now is having trouble using it. Wants referral

## 2010-08-07 NOTE — Patient Instructions (Signed)
Hand splint as instructed. After films are reviewed, we will make a referral to orthopedics, if necessary.

## 2010-08-08 NOTE — Progress Notes (Signed)
  Subjective:    Patient ID: [name], male    DOB: Mar 21, 1985, 25 y.o.   MRN: [id number]  HPI Presents to clinic with complaints of pain to his left hand after falling on it forward this past Monday. He has a history of trauma to the left hand, which includes both ligament, and nerve damage. Since falling on his left hand on Monday, he states he is having the same type of pain. He had when he had injured his hand previously. Denies any swelling, redness, or new deformity to the left hand.   Review of Systems As per history of present illness.    Objective:   Physical Exam No swelling, redness, deformity noted to the left hand. The fourth and fifth digits of the left hand remained in a stationary curved position, which appears to be chronic, not acute.       Assessment & Plan:  Injury, Left Hand. X-ray left hand shows no trauma or new fractures. This baby nothing more than posttraumatic musculoskeletal pain. At this point would recommend, because there is no swelling, that he perform range of motion exercises in warm water. Additionally, we will give him, tramadol 100 mg by mouth every 8 hours when necessary plus ibuprofen, 600 mg by mouth every 8 hours when necessary to be taken together. Depending on his response to the exercises, and the pain medication, a referral to orthopedics be necessary. If there is no improvement.  He verbally acknowledged this information and agreed with plan of care.

## 2010-08-13 ENCOUNTER — Telehealth: Payer: Self-pay | Admitting: *Deleted

## 2010-08-13 NOTE — Telephone Encounter (Signed)
Told him xray was negative for fx. He asked about having the hand re-broken & set correctly. I did not see anything about this in the chart. Told him I will pass this to his NP. He states he talked about this with Dr. Sampson Goon a while back

## 2010-08-14 ENCOUNTER — Telehealth: Payer: Self-pay | Admitting: *Deleted

## 2010-08-14 NOTE — Telephone Encounter (Signed)
If he wants this to be evaluated, he should follow-up with orthopedic surgeon who originally worked on his hand in the past.  They can easily obtain the film reports and film copies.  If he does not have an orthopedist in [place], he should inform us and we will make an ambulatory referral to Ortho from our office.

## 2010-08-14 NOTE — Telephone Encounter (Signed)
I arranged an appt for him to see the ortho he saw in 2007 for his hand problems. It is 08/20/10 at 3:30. I called & told him where & gave him the phone #. He is aware to bring id, co-pay,all meds & be on time

## 2010-09-02 ENCOUNTER — Ambulatory Visit (INDEPENDENT_AMBULATORY_CARE_PROVIDER_SITE_OTHER): Payer: Medicaid Other | Admitting: *Deleted

## 2010-09-02 ENCOUNTER — Encounter: Payer: Self-pay | Admitting: *Deleted

## 2010-09-02 VITALS — BP 106/76 | HR 54 | Temp 98.2°F | Resp 18 | Ht 69.0 in | Wt 124.2 lb

## 2010-09-02 DIAGNOSIS — B20 Human immunodeficiency virus [HIV] disease: Secondary | ICD-10-CM

## 2010-09-02 LAB — BASIC METABOLIC PANEL
BUN: 7 mg/dL (ref 6–23)
CO2: 26 mEq/L (ref 19–32)
Calcium: 9.4 mg/dL (ref 8.4–10.5)
Creat: 0.85 mg/dL (ref 0.50–1.35)
Glucose, Bld: 78 mg/dL (ref 70–99)

## 2010-09-02 MED ORDER — ENSURE PO LIQD: ORAL | Status: DC

## 2010-09-02 NOTE — Progress Notes (Signed)
09/02/2010 @ 10am: Pt here for research study A5257, week 112. Pt assessment unchanged since last study visit. Pt has had a steady decrease in weight 4-5 lbs every 4 months. Current BMI 18.3 which is underweight. Pt on TAP program via [facility] and was prescribed ensure. [facility] currently out of ensure and patient was made aware of this. Fasting labs were drawn. Received $20.00 gift card for study visit. ARV medications were dispensed at pharmacy. Next research appointment scheduled for Tuesday, Dec. 4, 2012 at 10:00 am. -- Tacey Heap RN

## 2010-10-03 ENCOUNTER — Encounter: Payer: Self-pay | Admitting: Adult Health

## 2010-10-03 LAB — CD4/CD8 (T-HELPER/T-SUPPRESSOR CELL)
CD4: 520
CD8 % Suppressor T Cell: 54.6
CD8: 1420

## 2010-10-03 LAB — HIV-1 RNA QUANT-NO REFLEX-BLD: HIV-1 RNA Viral Load: <40

## 2010-11-11 LAB — CULTURE, ROUTINE-ABSCESS

## 2010-12-31 ENCOUNTER — Ambulatory Visit (INDEPENDENT_AMBULATORY_CARE_PROVIDER_SITE_OTHER): Payer: Medicaid Other | Admitting: *Deleted

## 2010-12-31 VITALS — BP 120/70 | HR 62 | Temp 98.5°F | Resp 16 | Wt 137.0 lb

## 2010-12-31 DIAGNOSIS — B20 Human immunodeficiency virus [HIV] disease: Secondary | ICD-10-CM

## 2010-12-31 DIAGNOSIS — Z23 Encounter for immunization: Secondary | ICD-10-CM

## 2010-12-31 DIAGNOSIS — Z0000 Encounter for general adult medical examination without abnormal findings: Secondary | ICD-10-CM

## 2010-12-31 LAB — HEPATIC FUNCTION PANEL
ALT: 18 U/L (ref 0–53)
AST: 16 U/L (ref 0–37)
Albumin: 4.3 g/dL (ref 3.5–5.2)
Alkaline Phosphatase: 70 U/L (ref 39–117)
Total Protein: 7 g/dL (ref 6.0–8.3)

## 2010-12-31 LAB — PHOSPHORUS: Phosphorus: 3.7 mg/dL (ref 2.3–4.6)

## 2010-12-31 LAB — BASIC METABOLIC PANEL
BUN: 11 mg/dL (ref 6–23)
CO2: 25 mEq/L (ref 19–32)
Chloride: 109 mEq/L (ref 96–112)
Glucose, Bld: 76 mg/dL (ref 70–99)
Potassium: 4.9 mEq/L (ref 3.5–5.3)
Sodium: 141 mEq/L (ref 135–145)

## 2010-12-31 NOTE — Progress Notes (Signed)
Patient here for week 128 study visit. He denies any new problems except for painful new tattoo on his back. He will return in March for his next visit. He was reminded to schedule an appt with his provider soon.

## 2011-02-04 ENCOUNTER — Encounter: Payer: Self-pay | Admitting: Infectious Disease

## 2011-02-04 LAB — CD4/CD8 (T-HELPER/T-SUPPRESSOR CELL): CD8: 1425

## 2011-02-10 ENCOUNTER — Ambulatory Visit: Payer: Medicaid Other | Admitting: Infectious Disease

## 2011-02-14 ENCOUNTER — Ambulatory Visit: Payer: Medicaid Other | Admitting: Infectious Disease

## 2011-02-26 ENCOUNTER — Ambulatory Visit: Payer: Medicaid Other | Admitting: Infectious Disease

## 2011-02-26 ENCOUNTER — Telehealth: Payer: Self-pay | Admitting: Licensed Clinical Social Worker

## 2011-02-26 NOTE — Telephone Encounter (Signed)
Patient no showed for today's appointment, I spoke with him and he rescheduled for 03/13/2011 at 10:30am.

## 2011-03-13 ENCOUNTER — Ambulatory Visit: Payer: Medicaid Other | Admitting: Adult Health

## 2011-03-14 ENCOUNTER — Ambulatory Visit: Payer: Medicaid Other | Admitting: Adult Health

## 2011-03-14 ENCOUNTER — Telehealth: Payer: Self-pay | Admitting: *Deleted

## 2011-03-14 NOTE — Telephone Encounter (Signed)
I called & left a message offering him a 1:30 appt today. I asked that he call back to let us know if he can make it

## 2011-04-07 ENCOUNTER — Other Ambulatory Visit: Payer: Self-pay | Admitting: Infectious Disease

## 2011-04-07 ENCOUNTER — Ambulatory Visit (INDEPENDENT_AMBULATORY_CARE_PROVIDER_SITE_OTHER): Payer: Medicaid Other | Admitting: *Deleted

## 2011-04-07 ENCOUNTER — Ambulatory Visit: Payer: Medicaid Other | Admitting: Adult Health

## 2011-04-07 ENCOUNTER — Encounter: Payer: Self-pay | Admitting: Infectious Disease

## 2011-04-07 ENCOUNTER — Ambulatory Visit (INDEPENDENT_AMBULATORY_CARE_PROVIDER_SITE_OTHER): Payer: Medicaid Other | Admitting: Infectious Disease

## 2011-04-07 VITALS — BP 120/80 | HR 77 | Temp 98.1°F | Resp 16 | Ht 69.5 in | Wt 136.5 lb

## 2011-04-07 VITALS — BP 129/69 | HR 77 | Temp 98.1°F | Wt 136.5 lb

## 2011-04-07 DIAGNOSIS — Z21 Asymptomatic human immunodeficiency virus [HIV] infection status: Secondary | ICD-10-CM

## 2011-04-07 DIAGNOSIS — Z23 Encounter for immunization: Secondary | ICD-10-CM

## 2011-04-07 DIAGNOSIS — L0292 Furuncle, unspecified: Secondary | ICD-10-CM

## 2011-04-07 DIAGNOSIS — B20 Human immunodeficiency virus [HIV] disease: Secondary | ICD-10-CM

## 2011-04-07 DIAGNOSIS — F17200 Nicotine dependence, unspecified, uncomplicated: Secondary | ICD-10-CM

## 2011-04-07 DIAGNOSIS — Z720 Tobacco use: Secondary | ICD-10-CM

## 2011-04-07 LAB — LIPID PANEL
Cholesterol: 101 mg/dL (ref 0–200)
Total CHOL/HDL Ratio: 2.7 Ratio
Triglycerides: 52 mg/dL (ref <150)

## 2011-04-07 LAB — POCT URINALYSIS DIPSTICK
Glucose, UA: NEGATIVE
Ketones, UA: 15
Leukocytes, UA: NEGATIVE
pH, UA: 7

## 2011-04-07 LAB — BASIC METABOLIC PANEL
CO2: 22 mEq/L (ref 19–32)
Chloride: 109 mEq/L (ref 96–112)
Potassium: 4.3 mEq/L (ref 3.5–5.3)
Sodium: 141 mEq/L (ref 135–145)

## 2011-04-07 LAB — HEPATIC FUNCTION PANEL
Albumin: 4.2 g/dL (ref 3.5–5.2)
Bilirubin, Direct: 0.1 mg/dL (ref 0.0–0.3)
Indirect Bilirubin: 0.1 mg/dL (ref 0.0–0.9)

## 2011-04-07 LAB — PHOSPHORUS: Phosphorus: 4 mg/dL (ref 2.3–4.6)

## 2011-04-07 MED ORDER — DARUNAVIR ETHANOLATE 800 MG PO TABS: 800.0000 mg | ORAL_TABLET | Freq: Every day | ORAL | Status: DC

## 2011-04-07 MED ORDER — DOXYCYCLINE HYCLATE 100 MG PO TABS: 100.0000 mg | ORAL_TABLET | Freq: Two times a day (BID) | ORAL | Status: AC

## 2011-04-07 MED ORDER — PNEUMOCOCCAL VAC POLYVALENT 25 MCG/0.5ML IJ INJ: 0.5000 mL | INJECTION | INTRAMUSCULAR | Status: DC

## 2011-04-07 NOTE — Patient Instructions (Signed)
WITH LABS

## 2011-04-07 NOTE — Progress Notes (Signed)
Patient ID: [name], male   DOB: 04-09-1985, 26 y.o.   MRN: [id number] Pt had additional co of pain in left groin where he did also have painful lymphadenopathy. He had no penile dc. I sent urine for gc and chlamydia and gave doxy to rx for possible hair follicle infection

## 2011-04-07 NOTE — Assessment & Plan Note (Signed)
counselled to quit 

## 2011-04-07 NOTE — Progress Notes (Signed)
  Subjective:    Patient ID: [name], male    DOB: 01/03/1986, 26 y.o.   MRN: [id number]  HPI  [name] is a 26 y.o. male who is doing superbly well on his antiviral regimen, (prezista norvir and truvada) with undetectable viral load and health cd4 count. He is receiving his ARV via [facility]257 and will be coming off study shortly. I reviewed with him other ARV regimens but would feel most comfortable with him remaining on boosted prezista norvir and truvada cfor now. He needs pneumovax and he is counselled to quit tobacco. I spent greater than 45 minutes with the patient including greater than 50% of time in face to face counsel of the patient and in coordination of their care.    Review of Systems  Constitutional: Negative for fever, chills, diaphoresis, activity change, appetite change, fatigue and unexpected weight change.  HENT: Negative for congestion, sore throat, rhinorrhea, sneezing, trouble swallowing and sinus pressure.   Eyes: Negative for photophobia and visual disturbance.  Respiratory: Negative for cough, chest tightness, shortness of breath, wheezing and stridor.   Cardiovascular: Negative for chest pain, palpitations and leg swelling.  Gastrointestinal: Negative for nausea, vomiting, abdominal pain, diarrhea, constipation, blood in stool, abdominal distention and anal bleeding.  Genitourinary: Negative for dysuria, hematuria, flank pain and difficulty urinating.  Musculoskeletal: Negative for myalgias, back pain, joint swelling, arthralgias and gait problem.  Skin: Negative for color change, pallor, rash and wound.  Neurological: Negative for dizziness, tremors, weakness and light-headedness.  Hematological: Negative for adenopathy. Does not bruise/bleed easily.  Psychiatric/Behavioral: Negative for behavioral problems, confusion, sleep disturbance, dysphoric mood, decreased concentration and agitation.       Objective:   Physical Exam  Constitutional: He is  oriented to person, place, and time. He appears well-developed and well-nourished. No distress.  HENT:  Head: Normocephalic and atraumatic.  Mouth/Throat: Oropharynx is clear and moist. No oropharyngeal exudate.  Eyes: Conjunctivae and EOM are normal. Pupils are equal, round, and reactive to light. No scleral icterus.  Neck: Normal range of motion. Neck supple. No JVD present.  Cardiovascular: Normal rate, regular rhythm and normal heart sounds.  Exam reveals no gallop and no friction rub.   No murmur heard. Pulmonary/Chest: Effort normal and breath sounds normal. No respiratory distress. He has no wheezes. He has no rales. He exhibits no tenderness.  Abdominal: He exhibits no distension and no mass. There is no tenderness. There is no rebound and no guarding.  Musculoskeletal: He exhibits no edema and no tenderness.  Lymphadenopathy:    He has no cervical adenopathy.  Neurological: He is alert and oriented to person, place, and time. He has normal reflexes. He exhibits normal muscle tone. Coordination normal.  Skin: Skin is warm and dry. He is not diaphoretic. No erythema. No pallor.  Psychiatric: He has a normal mood and affect. His behavior is normal. Judgment and thought content normal.          Assessment & Plan:  HIV INFECTION Continue prezista norvir and truvada  Tobacco abuse counselled to quit

## 2011-04-07 NOTE — Progress Notes (Signed)
Patient here for his week 144 study visit. This is the last visit where he will receive meds and he will have 1 more visit before coming off study. He reports good adherence with his meds with an occassional miss of a dose. He does report some new problems of dizziness occuring appr. 1 x day last ing for a few minutes only. Sometimes occuring when he stands up suddenly. I did tell him that his BP may be dropping suddenly and he needed to move slowly especially when changing positions after a long period. He also has a tender knot on his rt. inner thigh past 2 weeks that is bothering him, he is going to have Dr. Daiva Eves look at.

## 2011-04-07 NOTE — Progress Notes (Signed)
Addended by: Mariea Clonts D on: 04/07/2011 11:12 AM   Modules accepted: Orders

## 2011-04-07 NOTE — Assessment & Plan Note (Signed)
Continue prezista norvir and truvada 

## 2011-04-09 ENCOUNTER — Other Ambulatory Visit: Payer: Self-pay | Admitting: *Deleted

## 2011-04-09 ENCOUNTER — Other Ambulatory Visit: Payer: Self-pay | Admitting: Infectious Disease

## 2011-04-09 DIAGNOSIS — Z113 Encounter for screening for infections with a predominantly sexual mode of transmission: Secondary | ICD-10-CM

## 2011-04-09 DIAGNOSIS — B20 Human immunodeficiency virus [HIV] disease: Secondary | ICD-10-CM

## 2011-04-09 LAB — GC PROBE AMPLIFICATION, URINE: GC Probe Amp, Urine: UNDETERMINED

## 2011-04-09 NOTE — Progress Notes (Signed)
Pt had indeterminate GC probe in urine need to re-test

## 2011-04-10 ENCOUNTER — Telehealth: Payer: Self-pay | Admitting: Licensed Clinical Social Worker

## 2011-04-10 NOTE — Telephone Encounter (Signed)
Patient has an indeterminate GC/CHlamydia test per Dr. Daiva Eves patient needs to retest. I called the patient and left message for him to call me.

## 2011-04-10 NOTE — Telephone Encounter (Signed)
Patient is coming in on Monday to be retested.Marland Kitchen

## 2011-04-11 ENCOUNTER — Other Ambulatory Visit: Payer: Self-pay | Admitting: *Deleted

## 2011-04-11 DIAGNOSIS — Z7721 Contact with and (suspected) exposure to potentially hazardous body fluids: Secondary | ICD-10-CM

## 2011-04-14 ENCOUNTER — Other Ambulatory Visit: Payer: Medicaid Other

## 2011-04-23 ENCOUNTER — Emergency Department (INDEPENDENT_AMBULATORY_CARE_PROVIDER_SITE_OTHER): Payer: Medicare Other

## 2011-04-23 ENCOUNTER — Encounter (HOSPITAL_COMMUNITY): Payer: Self-pay | Admitting: *Deleted

## 2011-04-23 ENCOUNTER — Emergency Department (INDEPENDENT_AMBULATORY_CARE_PROVIDER_SITE_OTHER)
Admission: EM | Admit: 2011-04-23 | Discharge: 2011-04-23 | Disposition: A | Discharge status: Home / Self Care | Payer: Medicare Other | Source: Home / Self Care | Attending: Emergency Medicine | Admitting: Emergency Medicine

## 2011-04-23 DIAGNOSIS — S7000XA Contusion of unspecified hip, initial encounter: Secondary | ICD-10-CM

## 2011-04-23 DIAGNOSIS — S7001XA Contusion of right hip, initial encounter: Secondary | ICD-10-CM

## 2011-04-23 HISTORY — DX: Human immunodeficiency virus (HIV) disease: B20

## 2011-04-23 HISTORY — DX: Bronchitis, not specified as acute or chronic: J40

## 2011-04-23 MED ORDER — METAXALONE 800 MG PO TABS: 800.0000 mg | ORAL_TABLET | Freq: Three times a day (TID) | ORAL | Status: AC

## 2011-04-23 MED ORDER — HYDROCODONE-ACETAMINOPHEN 5-325 MG PO TABS: 2.0000 | ORAL_TABLET | ORAL | Status: DC | PRN

## 2011-04-23 MED ORDER — MELOXICAM 15 MG PO TABS: 15.0000 mg | ORAL_TABLET | Freq: Every day | ORAL | Status: DC

## 2011-04-23 NOTE — Discharge Instructions (Signed)
Take the medication as written. Take 1 gram of tylenol up to 4 times a day as needed for pain and fever. This with the meloxicam is an effective combination for pain. Take the hydrocodone/norco only for severe pain. Do not take the tylenol and hydrocodone/norco as they both have tylenol in them and too much can hurt your liver. Return if you get worse, have a  fever >100.4, or for any concerns.   Go to www.goodrx.com to look up your medications. This will give you a list of where you can find your prescriptions at the most affordable prices.   

## 2011-04-23 NOTE — ED Provider Notes (Signed)
History     CSN: [id number]  Arrival date & time 04/23/11  4098   First MD Initiated Contact with Patient 04/23/11 0820      Chief Complaint  Patient presents with  . Hip Pain  . Fall    (Consider location/radiation/quality/duration/timing/severity/associated sxs/prior treatment) HPI Comments: Patient reports slipping and falling down some brick stairs 5 days ago, landing on his right hip. Now reports dull, achy, nonradiating, lateral right hip pain, worse with walking. No alleviating factors He's tried an unknown muscle relaxant without improvement. Patient has not tried anything else. No bruising, paresthesias, inability to bear weight, nausea, vomiting, fevers, back pain, leg, knee, ankle pain. No history of injury to his head. No history of prolonged steroid use, known osteoporosis.   ROS as noted in HPI. All other ROS negative.   Patient is a 26 y.o. male presenting with hip pain. The history is provided by the patient. No language interpreter was used.  Hip Pain This is a new problem. The current episode started more than 2 days ago. The problem occurs constantly. The problem has not changed since onset.The symptoms are aggravated by walking. The symptoms are relieved by nothing. Treatments tried: Unknown muscle relaxant. The treatment provided no relief.    Past Medical History  Diagnosis Date  . HIV disease     CD4 488, viral load undetectable on 12/2010  . Bronchitis     Past Surgical History  Procedure Date  . Hand surgery     History reviewed. No pertinent family history.  History  Substance Use Topics  . Smoking status: Current Everyday Smoker -- 0.3 packs/day    Types: Cigarettes  . Smokeless tobacco: Never Used  . Alcohol Use: No      Review of Systems  Allergies  Review of patient's allergies indicates no known allergies.  Home Medications   Current Outpatient Rx  Name Route Sig Dispense Refill  . DARUNAVIR ETHANOLATE 800 MG PO TABS Oral Take  1 tablet (800 mg total) by mouth daily. 30 tablet 11  . EMTRICITABINE-TENOFOVIR 200-300 MG PO TABS Oral Take 1 tablet by mouth daily. Study provided     . ENSURE PO LIQD  Ensure 1 can daily. Dispense 1 case/ month. 11 refills. 237 mL 12    Dispense as written.  Marland Kitchen HYDROCODONE-ACETAMINOPHEN 5-325 MG PO TABS Oral Take 2 tablets by mouth every 4 (four) hours as needed for pain. 20 tablet 0  . KETOCONAZOLE 2 % EX CREA Topical Apply topically 2 (two) times daily. As directed 30 g 1  . MELOXICAM 15 MG PO TABS Oral Take 1 tablet (15 mg total) by mouth daily. 14 tablet 0  . METAXALONE 800 MG PO TABS Oral Take 1 tablet (800 mg total) by mouth 3 (three) times daily. 21 tablet 0  . NORVIR 100 MG PO TABS  TAKE 1 TABLET BY MOUTH WITH FOOD AND PREZISTA 30 tablet PRN  . RITONAVIR 100 MG PO CAPS Oral Take 1 capsule (100 mg total) by mouth daily. Not study provided 30 capsule 3    BP 116/67  Pulse 76  Temp(Src) 97 F (36.1 C) (Oral)  Resp 16  SpO2 96%  Physical Exam  Nursing note and vitals reviewed. Constitutional: He is oriented to person, place, and time. He appears well-developed and well-nourished.  HENT:  Head: Normocephalic and atraumatic.  Eyes: Conjunctivae and EOM are normal.  Neck: Normal range of motion.  Cardiovascular: Normal rate.   Pulmonary/Chest: Effort normal. No respiratory distress.  Abdominal: He exhibits no distension.  Musculoskeletal: Normal range of motion.       No signs of trauma R hip. No bruising, erythema, rash. Diffuse muscular tenderness over gluteal muscles, greater trochanter. No tenderness along IT band, quadriceps. No pain with passive abduction/adduction of leg. Mild pain with int/ext rotation hip. No tenderness at sciatic notch. Roll test for muscle spasm positive. Flexion/extension knee WNL. Knee joint NT, stable. Motor strenght flexion/ext hip 5/5. Sensation to LT intact.   Neurological: He is alert and oriented to person, place, and time.  Skin: Skin is warm  and dry.  Psychiatric: He has a normal mood and affect. His behavior is normal.    ED Course  Procedures (including critical care time)   Labs Reviewed  GC/CHLAMYDIA PROBE AMP, URINE   Dg Hip Complete Right  04/23/2011  *RADIOLOGY REPORT*  Clinical Data: Larey Seat and injured right hip approximately 5 days ago, persistent lateral pain and difficulty walking.  RIGHT HIP - COMPLETE 2+ VIEW 04/23/2011:  Comparison: None.  Findings: No evidence of acute or subacute fracture or dislocation. Well-preserved joint space.  Well-preserved bone mineral density. Faint calcification just lateral to the acetabular roof.  Included AP pelvis demonstrates a normal-appearing contralateral left hip.  Sacroiliac joints and symphysis pubis intact. Visualized lower lumbar spine intact.  IMPRESSION:  1.  No acute or subacute osseous abnormality. 2.  Calcification adjacent to the acetabular roof, either representing calcific rectus femoris tendonitis or calcification within the labrum which can be seen in patients with femoroacetabular impingement.  Original Report Authenticated By: Arnell Sieving, M.D.     1. Contusion of right hip     X-ray reviewed by myself. No fracture. Full report per radiologist  MDM  Previous records, labs and reviewed. Patient seen 16 days ago for regular checkup. Patient HIV positive undetectable viral load and CD4 count 488 Was complaining of left groin pain where he had lymphadenopathy- was prescribed doxy for possible folliculitis.  GC Chlamydia urine was sent - came back indeterminate- and was instructed to come in on 3/18 to be retested. Patient states that he had some family issues, and did not go back to be retested. Will retest patient today, since we have been here, and have his PMD followup on results.  X-ray had, as patient has bony tenderness. If this is negative, will treat as a contusion with subsequent muscle spasm. Was sent home with NSAIDs, muscle relaxants. Patient agrees  with plan.  Luiz Blare, MD 04/23/11 [phone]

## 2011-04-23 NOTE — ED Notes (Signed)
Pt fell Friday c/o right hip pain - increased pain with movement

## 2011-04-24 ENCOUNTER — Encounter: Payer: Self-pay | Admitting: Infectious Disease

## 2011-04-24 LAB — GC/CHLAMYDIA PROBE AMP, URINE: GC Probe Amp, Urine: NEGATIVE

## 2011-05-06 ENCOUNTER — Emergency Department (HOSPITAL_COMMUNITY)
Admission: EM | Admit: 2011-05-06 | Discharge: 2011-05-06 | Disposition: A | Discharge status: Home / Self Care | Payer: Medicare Other

## 2011-05-06 NOTE — ED Notes (Signed)
Called x 1 for triage in triage waiting and main waiting with no reponse.

## 2011-05-06 NOTE — ED Notes (Signed)
Called x 2 for patient to triage with no response.

## 2011-05-07 ENCOUNTER — Emergency Department (HOSPITAL_COMMUNITY)
Admission: EM | Admit: 2011-05-07 | Discharge: 2011-05-07 | Disposition: A | Discharge status: Home / Self Care | Payer: Medicare Other | Attending: Emergency Medicine | Admitting: Emergency Medicine

## 2011-05-07 ENCOUNTER — Encounter (HOSPITAL_COMMUNITY): Payer: Self-pay | Admitting: Emergency Medicine

## 2011-05-07 ENCOUNTER — Emergency Department (HOSPITAL_COMMUNITY): Payer: Medicare Other

## 2011-05-07 DIAGNOSIS — R21 Rash and other nonspecific skin eruption: Secondary | ICD-10-CM | POA: Insufficient documentation

## 2011-05-07 DIAGNOSIS — L03115 Cellulitis of right lower limb: Secondary | ICD-10-CM

## 2011-05-07 DIAGNOSIS — L02619 Cutaneous abscess of unspecified foot: Secondary | ICD-10-CM | POA: Insufficient documentation

## 2011-05-07 DIAGNOSIS — Z79899 Other long term (current) drug therapy: Secondary | ICD-10-CM | POA: Insufficient documentation

## 2011-05-07 DIAGNOSIS — Z21 Asymptomatic human immunodeficiency virus [HIV] infection status: Secondary | ICD-10-CM | POA: Insufficient documentation

## 2011-05-07 DIAGNOSIS — M79609 Pain in unspecified limb: Secondary | ICD-10-CM | POA: Insufficient documentation

## 2011-05-07 MED ORDER — SULFAMETHOXAZOLE-TRIMETHOPRIM 800-160 MG PO TABS: 1.0000 | ORAL_TABLET | Freq: Two times a day (BID) | ORAL | Status: DC

## 2011-05-07 MED ORDER — NAPROXEN 500 MG PO TABS: 500.0000 mg | ORAL_TABLET | Freq: Two times a day (BID) | ORAL | Status: DC

## 2011-05-07 MED ORDER — OXYCODONE-ACETAMINOPHEN 5-325 MG PO TABS
2.0000 | ORAL_TABLET | Freq: Once | ORAL | Status: AC
  Administered 2011-05-07: 2 via ORAL
  Filled 2011-05-07: qty 2

## 2011-05-07 MED ORDER — SULFAMETHOXAZOLE-TRIMETHOPRIM 800-160 MG PO TABS: 1.0000 | ORAL_TABLET | Freq: Two times a day (BID) | ORAL | Status: AC

## 2011-05-07 MED ORDER — SULFAMETHOXAZOLE-TMP DS 800-160 MG PO TABS
1.0000 | ORAL_TABLET | Freq: Once | ORAL | Status: AC
  Administered 2011-05-07: 1 via ORAL
  Filled 2011-05-07: qty 1

## 2011-05-07 MED ORDER — OXYCODONE-ACETAMINOPHEN 5-325 MG PO TABS: 1.0000 | ORAL_TABLET | ORAL | Status: AC | PRN

## 2011-05-07 NOTE — Discharge Instructions (Signed)
Cellulitis Cellulitis is an infection of the tissue under the skin. The infected area is usually red and tender. This is caused by germs. These germs enter the body through cuts or sores. This usually happens in the arms or lower legs. HOME CARE   Take your medicine as told. Finish it even if you start to feel better.   If the infection is on the arm or leg, keep it raised (elevated).   Use a warm cloth on the infected area several times a day.   See your doctor for a follow-up visit as told.  GET HELP RIGHT AWAY IF:   You are tired or confused.   You throw up (vomit).   You have watery poop (diarrhea).   You feel ill and have muscle aches.   You have a fever.  MAKE SURE YOU:   Understand these instructions.   Will watch your condition.   Will get help right away if you are not doing well or get worse.  Document Released: 07/02/2007 Document Revised: 01/02/2011 Document Reviewed: 12/15/2008 [employer] Patient Information 2012 [employer]. 

## 2011-05-07 NOTE — ED Provider Notes (Signed)
History     CSN: [id number]  Arrival date & time 05/07/11  0458   First MD Initiated Contact with Patient 05/07/11 [phone]      Chief Complaint  Patient presents with  . Foot Pain    (Consider location/radiation/quality/duration/timing/severity/associated sxs/prior treatment) HPI Comments: 26 year old male with history of HIV presents with a complaint of right foot swelling and redness. According to the patient he awoke with these symptoms approximately 2 days ago, he is unsure if he's had any trauma to the foot or insect bites, he has had redness swelling and pain which has been persistent, worse with palpation and not associated with fevers chills nausea or vomiting.  Patient is a 26 y.o. male presenting with lower extremity pain. The history is provided by the patient.  Foot Pain This is a new problem. Episode onset: 2 days ago. The problem occurs constantly. The problem has been gradually worsening. Associated symptoms comments: No fevers chills nausea vomiting or any other joint pain. Exacerbated by: Ambulation and palpation. Relieved by: Rest and elevation. He has tried nothing for the symptoms.    Past Medical History  Diagnosis Date  . HIV disease     CD4 488, viral load undetectable on 12/2010  . Bronchitis     Past Surgical History  Procedure Date  . Hand surgery     Family History  Problem Relation Age of Onset  . Hypertension Mother   . Diabetes Mother     History  Substance Use Topics  . Smoking status: Current Everyday Smoker -- 0.3 packs/day    Types: Cigarettes  . Smokeless tobacco: Never Used  . Alcohol Use: No      Review of Systems  All other systems reviewed and are negative.    Allergies  Review of patient's allergies indicates no known allergies.  Home Medications   Current Outpatient Rx  Name Route Sig Dispense Refill  . ALBUTEROL SULFATE HFA 108 (90 BASE) MCG/ACT IN AERS Inhalation Inhale 2 puffs into the lungs every 6 (six) hours as  needed. Shortness of breath    . DARUNAVIR ETHANOLATE 800 MG PO TABS Oral Take 1 tablet (800 mg total) by mouth daily. 30 tablet 11  . EMTRICITABINE-TENOFOVIR 200-300 MG PO TABS Oral Take 1 tablet by mouth daily. Study provided     . ENSURE PO LIQD  Ensure 1 can daily. Dispense 1 case/ month. 11 refills. 237 mL 12    Dispense as written.  Marland Kitchen RITONAVIR 100 MG PO CAPS Oral Take 1 capsule (100 mg total) by mouth daily. Not study provided 30 capsule 3  . KETOCONAZOLE 2 % EX CREA Topical Apply topically 2 (two) times daily. As directed 30 g 1  . MELOXICAM 15 MG PO TABS Oral Take 1 tablet (15 mg total) by mouth daily. 14 tablet 0  . NAPROXEN 500 MG PO TABS Oral Take 1 tablet (500 mg total) by mouth 2 (two) times daily with a meal. 30 tablet 0  . OXYCODONE-ACETAMINOPHEN 5-325 MG PO TABS Oral Take 1 tablet by mouth every 4 (four) hours as needed for pain. May take 2 tablets PO q 6 hours for severe pain - Do not take with Tylenol as this tablet already contains tylenol 15 tablet 0  . SULFAMETHOXAZOLE-TRIMETHOPRIM 800-160 MG PO TABS Oral Take 1 tablet by mouth every 12 (twelve) hours. 20 tablet 0    BP 138/73  Pulse 64  Temp(Src) 98.2 F (36.8 C) (Oral)  Resp 20  SpO2 100%  Physical  Exam  Nursing note and vitals reviewed. Constitutional: He appears well-developed and well-nourished. No distress.  HENT:  Head: Normocephalic and atraumatic.  Mouth/Throat: Oropharynx is clear and moist. No oropharyngeal exudate.  Eyes: Conjunctivae and EOM are normal. Pupils are equal, round, and reactive to light. Right eye exhibits no discharge. Left eye exhibits no discharge. No scleral icterus.  Neck: Normal range of motion. Neck supple. No JVD present. No thyromegaly present.  Cardiovascular: Normal rate, regular rhythm, normal heart sounds and intact distal pulses.  Exam reveals no gallop and no friction rub.   No murmur heard. Pulmonary/Chest: Effort normal and breath sounds normal. No respiratory distress. He  has no wheezes. He has no rales.  Abdominal: Soft. Bowel sounds are normal. He exhibits no distension and no mass. There is no tenderness.  Musculoskeletal: Normal range of motion. He exhibits tenderness ( Tenderness with range of motion of the right foot and toes, no pain at the ankle, no tenderness with range of motion of the ankle). He exhibits no edema.       No edema or asymmetry of the legs, no other joints affected other than the right foot  Lymphadenopathy:    He has no cervical adenopathy.  Neurological: He is alert. Coordination normal.  Skin: Skin is warm and dry. Rash noted. There is erythema.       Warmth and erythema to the dorsal aspect of the right foot, mid foot, no associated abscesses lacerations puncture wounds, areas of ascending erythema in a lymphangitic pattern several centimeters above the ankle  Psychiatric: He has a normal mood and affect. His behavior is normal.    ED Course  Procedures (including critical care time)  Labs Reviewed - No data to display Dg Foot Complete Right  05/07/2011  *RADIOLOGY REPORT*  Clinical Data: Right foot pain, soft tissue swelling and erythema.  RIGHT FOOT COMPLETE - 3+ VIEW  Comparison: None.  Findings: There is no evidence of fracture or dislocation.  The joint spaces are preserved.  There is no evidence of talar subluxation; the subtalar joint is unremarkable in appearance.  An os peroneum is noted.  No significant soft tissue abnormalities are seen.  IMPRESSION:  1.  No evidence of fracture or dislocation. 2.  Os peroneum noted.  Original Report Authenticated By: Tonia Ghent, M.D.     1. Cellulitis of right foot       MDM  No fever no tachycardia no hypotension. The patient appears to have a cellulitis to the dorsal aspect of the right foot, he is HIV positive, will require antibiotics, mild early lymphangitis, will need close followup. Patient agrees to follow up very closely, antibiotics given in the emergency department  including Bactrim, pain medications including Percocet.  Xray neg for frx / FB  Discharge Prescriptions include:  #1 Percocet #2 Bactrim #3 Naprosyn       Vida Roller, MD 05/07/11 [phone]

## 2011-05-07 NOTE — ED Notes (Signed)
Pt states he had pain in his right foot on Sunday and when he woke up on Monday his foot was painful and swollen  Pt has redness and swelling noted to foot  Denies injury

## 2011-06-25 ENCOUNTER — Other Ambulatory Visit: Payer: Self-pay | Admitting: Infectious Disease

## 2011-07-02 ENCOUNTER — Encounter (HOSPITAL_COMMUNITY): Payer: Self-pay | Admitting: Emergency Medicine

## 2011-07-02 ENCOUNTER — Emergency Department (HOSPITAL_COMMUNITY)
Admission: EM | Admit: 2011-07-02 | Discharge: 2011-07-02 | Disposition: A | Discharge status: Home / Self Care | Payer: Medicare Other | Attending: Emergency Medicine | Admitting: Emergency Medicine

## 2011-07-02 ENCOUNTER — Emergency Department (HOSPITAL_COMMUNITY): Payer: Medicare Other

## 2011-07-02 DIAGNOSIS — L0292 Furuncle, unspecified: Secondary | ICD-10-CM

## 2011-07-02 DIAGNOSIS — M79609 Pain in unspecified limb: Secondary | ICD-10-CM | POA: Insufficient documentation

## 2011-07-02 DIAGNOSIS — L02629 Furuncle of unspecified foot: Secondary | ICD-10-CM | POA: Insufficient documentation

## 2011-07-02 LAB — POCT I-STAT, CHEM 8
BUN: 7 mg/dL (ref 6–23)
Creatinine, Ser: 0.9 mg/dL (ref 0.50–1.35)
Sodium: 144 mEq/L (ref 135–145)
TCO2: 27 mmol/L (ref 0–100)

## 2011-07-02 LAB — CBC
HCT: 37.3 % — ABNORMAL LOW (ref 39.0–52.0)
MCV: 84.4 fL (ref 78.0–100.0)
Platelets: 185 10*3/uL (ref 150–400)
RBC: 4.42 MIL/uL (ref 4.22–5.81)
WBC: 6.6 10*3/uL (ref 4.0–10.5)

## 2011-07-02 LAB — DIFFERENTIAL
Eosinophils Relative: 4 % (ref 0–5)
Lymphocytes Relative: 41 % (ref 12–46)
Lymphs Abs: 2.7 10*3/uL (ref 0.7–4.0)
Monocytes Absolute: 0.6 10*3/uL (ref 0.1–1.0)

## 2011-07-02 MED ORDER — NAPROXEN 500 MG PO TABS: 500.0000 mg | ORAL_TABLET | Freq: Two times a day (BID) | ORAL | Status: DC

## 2011-07-02 MED ORDER — SULFAMETHOXAZOLE-TRIMETHOPRIM 800-160 MG PO TABS: 1.0000 | ORAL_TABLET | Freq: Two times a day (BID) | ORAL | Status: AC

## 2011-07-02 NOTE — Discharge Instructions (Signed)
Wound Infection  A wound infection happens when a type of germ (bacteria) starts growing in the wound. In some cases, this can cause the wound to break open. If cared for properly, the infected wound will heal from the inside to the outside. Wound infections need treatment.  CAUSES  An infection is caused by bacteria growing in the wound.   SYMPTOMS    Increase in redness, swelling, or pain at the wound site.   Increase in drainage at the wound site.   Wound or bandage (dressing) starts to smell bad.   Fever.   Feeling tired or fatigued.   Pus draining from the wound.  TREATMENT   You caregiver will prescribe antibiotic medicine. The wound infection should improve within 24 to 48 hours. Any redness around the wound should stop spreading and the wound should be less painful.   HOME CARE INSTRUCTIONS    Only take over-the-counter or prescription medicines for pain, discomfort, or fever as directed by your caregiver.   Take your antibiotics as directed. Finish them even if you start to feel better.   Gently wash the area with mild soap and water 2 times a day, or as directed. Rinse off the soap. Pat the area dry with a clean towel. Do not rub the wound. This may cause bleeding.   Follow your caregiver's instructions for how often you need to change the dressing.   Apply ointment and a dressing to the wound as directed.   If the dressing sticks, moisten it with soapy water and gently remove it.   Change the bandage right away if it becomes wet, dirty, or develops a bad smell.   Take showers. Do not take tub baths, swim, or do anything that may soak the wound until it is healed.   Avoid exercises that make you sweat heavily.   Use anti-itch medicine as directed by your caregiver. The wound may itch when it is healing. Do not pick or scratch at the wound.   Follow up with your caregiver to get your wound rechecked as directed.  SEEK MEDICAL CARE IF:   You have an increase in swelling, pain, or redness  around the wound.   You have an increase in the amount of pus coming from the wound.   There is a bad smell coming from the wound.   More of the wound breaks open.   You have a fever.  MAKE SURE YOU:    Understand these instructions.   Will watch your condition.   Will get help right away if you are not doing well or get worse.  Document Released: 10/12/2002 Document Revised: 01/02/2011 Document Reviewed: 05/19/2010  [employer] Patient Information 2012 [employer].

## 2011-07-02 NOTE — ED Notes (Signed)
States one month ago seen at another hospital for right food pain swelling possible cellulitis or abscess.  Pain continued until today pain 9/10 achy burning.

## 2011-07-02 NOTE — ED Provider Notes (Signed)
History   This chart was scribed for Troy Kras, MD by Melba Coon. The patient was seen in room STRE1/STRE1 and the patient's care was started at 1:16PM.    CSN: [id number]  Arrival date & time 07/02/11  1158   None     Chief Complaint  Patient presents with  . Foot Pain    (Consider location/radiation/quality/duration/timing/severity/associated sxs/prior treatment) HPI Keiffer Piper is a 26 y.o. male who presents to the Emergency Department complaining of constant, moderate to severe right foot pain with an onset today. Pt states that pain has been present since 1 month ago when he went to [facility] to get the pain and swelling checked out along; dr stated it was a possible cellulitis or abscess and was referred to Dr. Roanna Raider, foot specialist. However, around onset today, pt states it has gotten progressively worse with a severity of 9/10 and burning, achy sensation. Pressure and physical exertion on the foot aggravates the pain. No HA, fever, neck pain, sore throat, rash, back pain, CP, SOB, abd pain, n/v/d, dysuria, or extremity weakness, numbness, or tingling. No known allergies. No other pertinent medical symptoms.   Past Medical History  Diagnosis Date  . HIV disease     CD4 488, viral load undetectable on 12/2010  . Bronchitis     Past Surgical History  Procedure Date  . Hand surgery     Family History  Problem Relation Age of Onset  . Hypertension Mother   . Diabetes Mother     History  Substance Use Topics  . Smoking status: Current Everyday Smoker -- 0.3 packs/day    Types: Cigarettes  . Smokeless tobacco: Never Used  . Alcohol Use: No      Review of Systems 10 Systems reviewed and all are negative for acute change except as noted in the HPI.   Allergies  Review of patient's allergies indicates no known allergies.  Home Medications   Current Outpatient Rx  Name Route Sig Dispense Refill  . ACETAMINOPHEN 500 MG PO TABS Oral  Take 1,000 mg by mouth every 6 (six) hours as needed. For pain    . DARUNAVIR ETHANOLATE 400 MG PO TABS Oral Take 400 mg by mouth daily.    Marland Kitchen EMTRICITABINE-TENOFOVIR 200-300 MG PO TABS Oral Take 1 tablet by mouth daily.    Marland Kitchen RITONAVIR 100 MG PO CAPS Oral Take 100 mg by mouth daily.      BP 127/63  Pulse 70  Temp(Src) 98.5 F (36.9 C) (Oral)  Resp 16  SpO2 100%  Physical Exam  Nursing note and vitals reviewed. Constitutional: He appears well-developed and well-nourished. No distress.  HENT:  Head: Normocephalic and atraumatic.  Right Ear: External ear normal.  Left Ear: External ear normal.  Eyes: Conjunctivae are normal. Right eye exhibits no discharge. Left eye exhibits no discharge. No scleral icterus.  Neck: Neck supple. No tracheal deviation present.  Cardiovascular: Normal rate, regular rhythm and intact distal pulses.   Pulmonary/Chest: Effort normal and breath sounds normal. No stridor. No respiratory distress. He has no wheezes. He has no rales.  Abdominal: Soft. Bowel sounds are normal. He exhibits no distension. There is no tenderness. There is no rebound and no guarding.  Musculoskeletal: He exhibits no edema and no tenderness.  Neurological: He is alert. He has normal strength. No sensory deficit. Cranial nerve deficit:  no gross defecits noted. He exhibits normal muscle tone. He displays no seizure activity. Coordination normal.  Skin: Skin is  warm and dry. No rash noted.       4th and 5th toe dorsal aspect of right foot, small skin ulceration with raised margins, mild erythema and tenderness; no edema or erythema throughout rest of foot, fissured caliced tissue in web space between 4th and 5th toe  Psychiatric: He has a normal mood and affect.    ED Course  Procedures (including critical care time)  DIAGNOSTIC STUDIES: Oxygen Saturation is 100% on room air, normal by my interpretation.    COORDINATION OF CARE:     Labs Reviewed  CBC - Abnormal; Notable for  the following:    Hemoglobin 12.7 (*)    HCT 37.3 (*)    All other components within normal limits  DIFFERENTIAL  POCT I-STAT, CHEM 8   Dg Foot Complete Right  07/02/2011  *RADIOLOGY REPORT*  Clinical Data: Dorsal foot pain  RIGHT FOOT COMPLETE - 3+ VIEW  Comparison: 05/07/2011  Findings: Three views of the right foot submitted.  No acute fracture or subluxation.  Os peroneum again noted. No periosteal reaction or bony erosion.  IMPRESSION: No acute fracture or subluxation.  No significant change.  Original Report Authenticated By: Natasha Mead, M.D.     1. Furuncle       MDM  Patient doesn't have any signs of extensive cellulitis. He does have a furuncle type lesion on his toe. The x-ray does not show any evidence of osteomyelitis. His laboratory tests and vital signs are reassuring. I encouraged the patient followup with his primary Dr. or foot specialist. I will start him on a course of Bactrim which she had been previously prescribed when he had this condition according to the  records. I personally performed the services described in this documentation, which was scribed in my presence.  The recorded information has been reviewed and considered.         Troy Kras, MD 07/02/11 1324

## 2011-07-10 ENCOUNTER — Ambulatory Visit (INDEPENDENT_AMBULATORY_CARE_PROVIDER_SITE_OTHER): Payer: Medicare Other | Admitting: Internal Medicine

## 2011-07-10 ENCOUNTER — Telehealth: Payer: Self-pay | Admitting: *Deleted

## 2011-07-10 ENCOUNTER — Ambulatory Visit (INDEPENDENT_AMBULATORY_CARE_PROVIDER_SITE_OTHER): Payer: Medicare Other | Admitting: *Deleted

## 2011-07-10 ENCOUNTER — Encounter: Payer: Self-pay | Admitting: Internal Medicine

## 2011-07-10 VITALS — BP 126/80 | HR 62 | Temp 98.5°F | Resp 16 | Wt 128.0 lb

## 2011-07-10 VITALS — BP 116/71 | HR 61 | Temp 97.4°F | Ht 69.0 in | Wt 127.0 lb

## 2011-07-10 DIAGNOSIS — L0292 Furuncle, unspecified: Secondary | ICD-10-CM

## 2011-07-10 DIAGNOSIS — B20 Human immunodeficiency virus [HIV] disease: Secondary | ICD-10-CM

## 2011-07-10 DIAGNOSIS — E785 Hyperlipidemia, unspecified: Secondary | ICD-10-CM

## 2011-07-10 DIAGNOSIS — Z21 Asymptomatic human immunodeficiency virus [HIV] infection status: Secondary | ICD-10-CM

## 2011-07-10 DIAGNOSIS — L0293 Carbuncle, unspecified: Secondary | ICD-10-CM

## 2011-07-10 LAB — BASIC METABOLIC PANEL
BUN: 9 mg/dL (ref 6–23)
CO2: 25 mEq/L (ref 19–32)
Glucose, Bld: 75 mg/dL (ref 70–99)
Potassium: 4.3 mEq/L (ref 3.5–5.3)

## 2011-07-10 LAB — LIPID PANEL
Cholesterol: 110 mg/dL (ref 0–200)
LDL Cholesterol: 58 mg/dL (ref 0–99)
VLDL: 19 mg/dL (ref 0–40)

## 2011-07-10 LAB — HEPATIC FUNCTION PANEL
Bilirubin, Direct: <0.1 mg/dL (ref 0.0–0.3)
Total Bilirubin: 0.2 mg/dL — ABNORMAL LOW (ref 0.3–1.2)

## 2011-07-10 LAB — PHOSPHORUS: Phosphorus: 4.3 mg/dL (ref 2.3–4.6)

## 2011-07-10 LAB — POCT URINALYSIS DIPSTICK
Bilirubin, UA: NEGATIVE
Glucose, UA: NEGATIVE
Ketones, UA: NEGATIVE
Nitrite, UA: NEGATIVE

## 2011-07-10 NOTE — Progress Notes (Signed)
Patient here for final study visit. He reports that he has been seen recently in the ED for his swollen foot. He is limping. His rt foot has an ulceration below the little toe that is draining pus, very tender to touch. Apparently this has been an ongoing problem since April when he was seen in the ED twice for the same foot and told to follow up with a foot doctor or his primary MD.He was given Bactrim in April for 10 days. He says the foot got worse the first of June and went to the ED on the 5th and was supposed to have gotten a prescription for Bactrim again and follow up with the foot doctor. Dr. Luciana Axe is seeing him today in the clinic on a walk-in basis. We have made an appt for him for this coming Tuesday at [facility]. He is aware of the date, time and location and has the forms to fill out prior to the visit. He was also instructed to find a primary MD for him to utilize for acute problems instead of going to the ED for everything. He has been seen 4 times in the ED in the past 3 months and had called himself an ambulance for 2 of those times. He is to follow up with Dr. Daiva Eves in September and has enough meds left to last him until then. He currently lives by himself and gets disability and medicaid.

## 2011-07-10 NOTE — Telephone Encounter (Signed)
[name] was with him for research. He asked if he could be seen for pain in r foot, 5th toe. It has an open wound on toe of foot. States it is a 7/10 & has been to ED for it & called ambulance for this. Dr. Luciana Axe agreed to see him. [facility] put him on the schedule.

## 2011-07-11 ENCOUNTER — Emergency Department (HOSPITAL_COMMUNITY)
Admission: EM | Admit: 2011-07-11 | Discharge: 2011-07-11 | Disposition: A | Discharge status: Home / Self Care | Payer: Medicare Other | Attending: Emergency Medicine | Admitting: Emergency Medicine

## 2011-07-11 ENCOUNTER — Emergency Department (HOSPITAL_COMMUNITY): Payer: Medicare Other

## 2011-07-11 ENCOUNTER — Encounter (HOSPITAL_COMMUNITY): Payer: Self-pay | Admitting: Emergency Medicine

## 2011-07-11 DIAGNOSIS — F17200 Nicotine dependence, unspecified, uncomplicated: Secondary | ICD-10-CM | POA: Insufficient documentation

## 2011-07-11 DIAGNOSIS — J40 Bronchitis, not specified as acute or chronic: Secondary | ICD-10-CM | POA: Insufficient documentation

## 2011-07-11 DIAGNOSIS — Z79899 Other long term (current) drug therapy: Secondary | ICD-10-CM | POA: Insufficient documentation

## 2011-07-11 DIAGNOSIS — Z21 Asymptomatic human immunodeficiency virus [HIV] infection status: Secondary | ICD-10-CM | POA: Insufficient documentation

## 2011-07-11 HISTORY — DX: Unspecified asthma, uncomplicated: J45.909

## 2011-07-11 LAB — DIFFERENTIAL
Basophils Relative: 0 % (ref 0–1)
Eosinophils Absolute: 0.1 10*3/uL (ref 0.0–0.7)
Monocytes Relative: 5 % (ref 3–12)
Neutrophils Relative %: 62 % (ref 43–77)

## 2011-07-11 LAB — POCT I-STAT, CHEM 8
Glucose, Bld: 86 mg/dL (ref 70–99)
HCT: 43 % (ref 39.0–52.0)
Hemoglobin: 14.6 g/dL (ref 13.0–17.0)
Potassium: 3.9 mEq/L (ref 3.5–5.1)
Sodium: 142 mEq/L (ref 135–145)
TCO2: 24 mmol/L (ref 0–100)

## 2011-07-11 LAB — CBC
MCH: 28.3 pg (ref 26.0–34.0)
MCHC: 34 g/dL (ref 30.0–36.0)
Platelets: 198 10*3/uL (ref 150–400)

## 2011-07-11 MED ORDER — AZITHROMYCIN 250 MG PO TABS: ORAL_TABLET | ORAL | Status: AC

## 2011-07-11 MED ORDER — ALBUTEROL SULFATE HFA 108 (90 BASE) MCG/ACT IN AERS: 1.0000 | INHALATION_SPRAY | Freq: Four times a day (QID) | RESPIRATORY_TRACT | Status: AC | PRN

## 2011-07-11 NOTE — ED Notes (Signed)
Rx x 2.  Pt voiced understanding to f/u with PCP and return for worsening condition.  

## 2011-07-11 NOTE — Discharge Instructions (Signed)
Bronchitis Bronchitis is a problem of the air tubes leading to your lungs. This problem makes it hard for air to get in and out of the lungs. You may cough a lot because your air tubes are narrow. Going without care can cause lasting (chronic) bronchitis. HOME CARE   Drink enough fluids to keep your pee (urine) clear or pale yellow.   Use a cool mist humidifier.   Quit smoking if you smoke. If you keep smoking, the bronchitis might not get better.   Only take medicine as told by your doctor.  GET HELP RIGHT AWAY IF:   Coughing keeps you awake.   You start to wheeze.   You become more sick or weak.   You have a hard time breathing or get short of breath.   You cough up blood.   Coughing lasts more than 2 weeks.   You have a fever.   Your baby is older than 3 months with a rectal temperature of 102 F (38.9 C) or higher.   Your baby is 3 months old or younger with a rectal temperature of 100.4 F (38 C) or higher.  MAKE SURE YOU:  Understand these instructions.   Will watch your condition.   Will get help right away if you are not doing well or get worse.  Document Released: 07/02/2007 Document Revised: 01/02/2011 Document Reviewed: 12/15/2008 [employer] Patient Information 2012 [employer]. 

## 2011-07-11 NOTE — ED Notes (Signed)
PT. REPORTS MID CHEST PAIN WITH SOB AND PRODUCTIVE COUGH ONSET YESTERDAY , DENIES  NAUSEA OR DIAPHORESIS, SLIGHT LIGHTHEADED.

## 2011-07-11 NOTE — ED Provider Notes (Signed)
History     CSN: [id number]  Arrival date & time 07/11/11  4098   First MD Initiated Contact with Patient 07/11/11 0405      Chief Complaint  Patient presents with  . Chest Pain    (Consider location/radiation/quality/duration/timing/severity/associated sxs/prior treatment) Patient is a 26 y.o. male presenting with cough. The history is provided by the patient.  Cough This is a new problem. The current episode started 2 days ago. The problem occurs constantly. The problem has not changed since onset.The cough is productive of purulent sputum. There has been no fever. Associated symptoms include chest pain and wheezing. Pertinent negatives include no chills, no sweats and no shortness of breath. Associated symptoms comments: Pain is constant and dull and in the center of the chest and non radiating.  No palpitations.  No tachycardia, no hemoptysis.  No leg pain or swelling no long car trips or plane trips.  Pain worse with coughing but has been constant. He has tried nothing for the symptoms. The treatment provided no relief. Risk factors: none. He is a smoker. His past medical history does not include COPD.    Past Medical History  Diagnosis Date  . HIV disease     CD4 488, viral load undetectable on 12/2010  . Bronchitis   . Asthma     Past Surgical History  Procedure Date  . Hand surgery     Family History  Problem Relation Age of Onset  . Hypertension Mother   . Diabetes Mother     History  Substance Use Topics  . Smoking status: Current Everyday Smoker -- 0.3 packs/day    Types: Cigarettes  . Smokeless tobacco: Never Used  . Alcohol Use: No      Review of Systems  Constitutional: Negative for fever and chills.  Respiratory: Positive for cough and wheezing. Negative for shortness of breath.   Cardiovascular: Positive for chest pain. Negative for palpitations and leg swelling.  Musculoskeletal: Negative for arthralgias.  All other systems reviewed and are  negative.    Allergies  Review of patient's allergies indicates no known allergies.  Home Medications   Current Outpatient Rx  Name Route Sig Dispense Refill  . ACETAMINOPHEN 500 MG PO TABS Oral Take 1,000 mg by mouth every 6 (six) hours as needed. For pain    . DARUNAVIR ETHANOLATE 400 MG PO TABS Oral Take 400 mg by mouth daily.    Marland Kitchen EMTRICITABINE-TENOFOVIR 200-300 MG PO TABS Oral Take 1 tablet by mouth daily.    Marland Kitchen NAPROXEN 500 MG PO TABS Oral Take 1 tablet (500 mg total) by mouth 2 (two) times daily with a meal. As needed for pain 20 tablet 0  . RITONAVIR 100 MG PO CAPS Oral Take 100 mg by mouth daily.    . SULFAMETHOXAZOLE-TRIMETHOPRIM 800-160 MG PO TABS Oral Take 1 tablet by mouth every 12 (twelve) hours. 20 tablet 0  . ALBUTEROL SULFATE HFA 108 (90 BASE) MCG/ACT IN AERS Inhalation Inhale 1-2 puffs into the lungs every 6 (six) hours as needed for wheezing. 1 Inhaler 0  . AZITHROMYCIN 250 MG PO TABS  2 po day one, then 1 daily x 4 days 5 tablet 0    BP 138/98  Pulse 70  Temp 98.4 F (36.9 C) (Oral)  Resp 18  SpO2 100%  Physical Exam  Constitutional: He is oriented to person, place, and time. He appears well-developed and well-nourished. No distress.  HENT:  Head: Normocephalic and atraumatic.  Mouth/Throat: Oropharynx is clear  and moist.  Eyes: Conjunctivae are normal. Pupils are equal, round, and reactive to light.  Neck: Normal range of motion.  Cardiovascular: Normal rate and regular rhythm.   Pulmonary/Chest: Effort normal and breath sounds normal. No respiratory distress. He has no wheezes. He has no rales.  Abdominal: Soft. Bowel sounds are normal. There is no tenderness.  Musculoskeletal: Normal range of motion. He exhibits no tenderness.  Neurological: He is alert and oriented to person, place, and time.  Skin: Skin is warm and dry. He is not diaphoretic.  Psychiatric: He has a normal mood and affect.    ED Course  Procedures (including critical care  time)  Labs Reviewed  CBC - Abnormal; Notable for the following:    HCT 38.8 (*)     All other components within normal limits  DIFFERENTIAL  POCT I-STAT, CHEM 8  POCT I-STAT TROPONIN I   Dg Chest 2 View  07/11/2011  *RADIOLOGY REPORT*  Clinical Data: Chest pain  CHEST - 2 VIEW  Comparison: 02/07/2010  Findings: Lungs are clear. No pleural effusion or pneumothorax. The cardiomediastinal contours are within normal limits. The visualized bones and soft tissues are without significant appreciable abnormality.  BB projects over the left back.  IMPRESSION: No radiographic evidence of acute cardiopulmonary process.  Original Report Authenticated By: Waneta Martins, M.D.     1. Bronchitis     PERC negative  MDM   Date: 07/11/2011  Rate: 79  Rhythm: normal sinus rhythm  QRS Axis: normal  Intervals: normal  ST/T Wave abnormalities: normal  Conduction Disutrbances: none  Narrative Interpretation: unremarkable  Symptoms are consistent with bronchitis in a smoker.  In this case based on literature antibiotics have been shown to improve outcomes.  Rx for albuterol for wheezing and azithromycin.  After 24 hours of constant dull pain one negative EKG and one troponin are sufficient to r/o ACS.  Patient is not short of breath at all and has no pleuritic component to his pain.  HR in the 50s and sats 100% on RA in room.  PERC negative and Wells score also 0 moreover symptoms not consistent with PE, very unlikely. Patient told to have recheck with Dr. Ninetta Lights on Monday to ensure he is improving and to return immediately to the closest ED for persistent chest pain, any shortness of breath ongoing productive cough, leg pain or swelling, fevers > 101 or hemoptysis (blood in the sputum).  Patient verbalizes understanding of all instructions and agrees to follow up        Aubrynn Katona Smitty Cords, MD 07/11/11 1610

## 2011-07-11 NOTE — ED Notes (Signed)
Pt c/o "pinching" midsternal chest pain since 1730 07/10/11. Pt states pain is worse with movement and deep breath, but always present. Pt denies sob/diaphoresis /N/V. Saw PCP today for same. Plan of care is to monitor pt, follow orders as directed by provider. Pt resting with family at bedside. Denies further needs at this time

## 2011-07-12 LAB — WOUND CULTURE
Gram Stain: NONE SEEN
Gram Stain: NONE SEEN
Organism ID, Bacteria: NO GROWTH

## 2011-07-13 NOTE — Progress Notes (Signed)
  Subjective:    Patient ID: [name], male    DOB: 20-Apr-1985, 26 y.o.   MRN: [id number]  HPI Comes in for a work in visit for a problem with his foot.  He has had a lesion on the ventral aspect of foot for about 2 months.  He has been seen in the ED for this and has been told to go to podiatry.  He however has not yet gone.  He is here for same problem.  It has been open and draining clear-like fluid.  No fever, no chills.     Review of Systems  Constitutional: Negative for fever, chills and fatigue.  Cardiovascular: Negative for leg swelling.  Musculoskeletal: Negative for myalgias, joint swelling and arthralgias.  Skin:       Open small lesion over toe, clear drainage, no pus, non tender.         Objective:   Physical Exam  Constitutional: He appears well-developed and well-nourished. No distress.  Cardiovascular: Normal rate, regular rhythm and normal heart sounds.   Skin:       Open small lesion over toe, clear drainage, no pus, non tender.            Assessment & Plan:

## 2011-07-13 NOTE — Assessment & Plan Note (Signed)
Furuncle on foot, he will be referred to podiatry.  Patient counselled on appropriate use of ED.

## 2011-07-30 ENCOUNTER — Encounter: Payer: Self-pay | Admitting: Infectious Disease

## 2011-07-30 LAB — CD4/CD8 (T-HELPER/T-SUPPRESSOR CELL): CD4: 663

## 2011-07-30 LAB — HIV-1 RNA QUANT-NO REFLEX-BLD: HIV-1 RNA Viral Load: 16943

## 2011-08-05 ENCOUNTER — Ambulatory Visit (INDEPENDENT_AMBULATORY_CARE_PROVIDER_SITE_OTHER): Payer: Medicare Other | Admitting: *Deleted

## 2011-08-05 DIAGNOSIS — B20 Human immunodeficiency virus [HIV] disease: Secondary | ICD-10-CM

## 2011-08-05 NOTE — Progress Notes (Signed)
[name]'s final Viral load on study came back at 16,943. He had been consistently suppressed while on study. He came in today to have it repeated for study. He said he had been taking them everyday and hadn't missed any meds. He is to follow up with Dr. Luciana Axe.

## 2011-09-15 NOTE — Addendum Note (Signed)
Addended by: Phill Myron on: 09/15/2011 03:59 PM   Modules accepted: Orders

## 2011-09-18 ENCOUNTER — Encounter: Payer: Self-pay | Admitting: *Deleted

## 2011-09-18 NOTE — Progress Notes (Signed)
[name]'s repeat viral load from 08/05/11 is 1,074. He is following up with Dr. Daiva Eves in a few weeks.

## 2011-10-02 ENCOUNTER — Other Ambulatory Visit (HOSPITAL_COMMUNITY)
Admission: RE | Admit: 2011-10-02 | Discharge: 2011-10-02 | Disposition: A | Discharge status: Home / Self Care | Payer: Medicare Other | Source: Ambulatory Visit | Attending: Infectious Disease | Admitting: Infectious Disease

## 2011-10-02 ENCOUNTER — Encounter: Payer: Self-pay | Admitting: Infectious Disease

## 2011-10-02 ENCOUNTER — Ambulatory Visit (INDEPENDENT_AMBULATORY_CARE_PROVIDER_SITE_OTHER): Payer: Medicare Other | Admitting: Infectious Disease

## 2011-10-02 VITALS — BP 126/69 | HR 69 | Temp 98.2°F

## 2011-10-02 DIAGNOSIS — Z720 Tobacco use: Secondary | ICD-10-CM

## 2011-10-02 DIAGNOSIS — Z113 Encounter for screening for infections with a predominantly sexual mode of transmission: Secondary | ICD-10-CM | POA: Insufficient documentation

## 2011-10-02 DIAGNOSIS — F17200 Nicotine dependence, unspecified, uncomplicated: Secondary | ICD-10-CM

## 2011-10-02 DIAGNOSIS — B20 Human immunodeficiency virus [HIV] disease: Secondary | ICD-10-CM

## 2011-10-02 NOTE — Assessment & Plan Note (Signed)
Recheck labs today continue Prezista Norvir Truvada which is higher barrier to resistance. Bring him back in 2 months time we'll recheck viral load and CD4 count at that time.

## 2011-10-02 NOTE — Progress Notes (Signed)
Subjective:    Patient ID: [name], male    DOB: [birth year], 26 y.o.   MRN: [id number]  HPI  26 year old African American male who had been enrolled in the AIDS clinical trials group trial #5257 where he was receiving Prezista Norvir and Truvada. While in the trial he had perfect neurological suppression and healthy CD4 count. This June his viral load jumped to above 17,000 copies per milliliter. He claimed that he was not missing any medications at that time though he didn't mention that he had a week of feeling sick and having nausea and vomiting diarrhea. Since then his viral load has come down to 1000 and in a month's time which is appropriate drop. I'm not sure if he has had genotype sequencing done through the AIDS clinical trials group 5257 but he would be unlikely to develop resistance on this current regimen. He claims to be highly adherent to it and we are going to check labs today. He states he has no further adverse side effects of nausea diarrhea and from his Prezista Norvir or Truvada.  Review of Systems  Constitutional: Negative for fever, chills, diaphoresis, activity change, appetite change, fatigue and unexpected weight change.  HENT: Negative for congestion, sore throat, rhinorrhea, sneezing, trouble swallowing and sinus pressure.   Eyes: Negative for photophobia and visual disturbance.  Respiratory: Negative for cough, chest tightness, shortness of breath, wheezing and stridor.   Cardiovascular: Negative for chest pain, palpitations and leg swelling.  Gastrointestinal: Negative for nausea, vomiting, abdominal pain, diarrhea, constipation, blood in stool, abdominal distention and anal bleeding.  Genitourinary: Negative for dysuria, hematuria, flank pain and difficulty urinating.  Musculoskeletal: Negative for myalgias, back pain, joint swelling, arthralgias and gait problem.  Skin: Negative for color change, pallor, rash and wound.  Neurological: Negative for dizziness,  tremors, weakness and light-headedness.  Hematological: Negative for adenopathy. Does not bruise/bleed easily.  Psychiatric/Behavioral: Negative for behavioral problems, confusion, disturbed wake/sleep cycle, dysphoric mood, decreased concentration and agitation.       Objective:   Physical Exam  Constitutional: He is oriented to person, place, and time. He appears well-developed and well-nourished. No distress.  HENT:  Head: Normocephalic and atraumatic.  Mouth/Throat: Oropharynx is clear and moist. No oropharyngeal exudate.  Eyes: Conjunctivae and EOM are normal. Pupils are equal, round, and reactive to light. No scleral icterus.  Neck: Normal range of motion. Neck supple. No JVD present.  Cardiovascular: Normal rate, regular rhythm and normal heart sounds.  Exam reveals no gallop and no friction rub.   No murmur heard. Pulmonary/Chest: Effort normal and breath sounds normal. No respiratory distress. He has no wheezes. He has no rales. He exhibits no tenderness.  Abdominal: He exhibits no distension and no mass. There is no tenderness. There is no rebound and no guarding.  Musculoskeletal: He exhibits no edema and no tenderness.  Lymphadenopathy:    He has no cervical adenopathy.  Neurological: He is alert and oriented to person, place, and time. He has normal reflexes. He exhibits normal muscle tone. Coordination normal.  Skin: Skin is warm and dry. He is not diaphoretic. No erythema. No pallor.  Psychiatric: He has a normal mood and affect. His behavior is normal. Judgment and thought content normal.          Assessment & Plan:  HIV INFECTION Recheck labs today continue Prezista Norvir Truvada which is higher barrier to resistance. Bring him back in 2 months time we'll recheck viral load and CD4 count at that time.  Tobacco abuse Counseled to quit he is down to a few cigarettes per day. He does still smoke marijuana as well.

## 2011-10-02 NOTE — Assessment & Plan Note (Signed)
Counseled to quit he is down to a few cigarettes per day. He does still smoke marijuana as well.

## 2011-10-03 LAB — COMPLETE METABOLIC PANEL WITH GFR
ALT: 14 U/L (ref 0–53)
AST: 16 U/L (ref 0–37)
Albumin: 4 g/dL (ref 3.5–5.2)
Alkaline Phosphatase: 60 U/L (ref 39–117)
Calcium: 9 mg/dL (ref 8.4–10.5)
Chloride: 108 mEq/L (ref 96–112)
Potassium: 3.9 mEq/L (ref 3.5–5.3)
Sodium: 138 mEq/L (ref 135–145)

## 2011-10-03 LAB — CBC WITH DIFFERENTIAL/PLATELET
Basophils Absolute: 0 10*3/uL (ref 0.0–0.1)
Basophils Relative: 1 % (ref 0–1)
HCT: 40.6 % (ref 39.0–52.0)
Lymphocytes Relative: 52 % — ABNORMAL HIGH (ref 12–46)
Monocytes Absolute: 0.5 10*3/uL (ref 0.1–1.0)
Neutro Abs: 2.4 10*3/uL (ref 1.7–7.7)
Neutrophils Relative %: 36 % — ABNORMAL LOW (ref 43–77)
Platelets: 219 10*3/uL (ref 150–400)
RDW: 14.7 % (ref 11.5–15.5)
WBC: 6.5 10*3/uL (ref 4.0–10.5)

## 2011-10-06 LAB — HIV-1 RNA QUANT-NO REFLEX-BLD
HIV 1 RNA Quant: 38 copies/mL — ABNORMAL HIGH (ref <20)
HIV-1 RNA Quant, Log: 1.58 {Log} — ABNORMAL HIGH (ref <1.30)

## 2011-10-13 ENCOUNTER — Telehealth: Payer: Self-pay

## 2011-10-13 NOTE — Telephone Encounter (Signed)
Pt calling for urine test results.  He stated Dr Daiva Eves told him to call today for results.  Urine GC and Chlamydia test negative.  Pt informed.  Laurell Josephs, RN

## 2011-11-26 ENCOUNTER — Other Ambulatory Visit (INDEPENDENT_AMBULATORY_CARE_PROVIDER_SITE_OTHER): Payer: Medicare Other

## 2011-11-26 DIAGNOSIS — Z720 Tobacco use: Secondary | ICD-10-CM

## 2011-11-26 DIAGNOSIS — B20 Human immunodeficiency virus [HIV] disease: Secondary | ICD-10-CM

## 2011-11-26 LAB — COMPLETE METABOLIC PANEL WITH GFR
ALT: 12 U/L (ref 0–53)
Albumin: 4.1 g/dL (ref 3.5–5.2)
Alkaline Phosphatase: 65 U/L (ref 39–117)
CO2: 29 mEq/L (ref 19–32)
GFR, Est African American: >89 mL/min
GFR, Est Non African American: >89 mL/min
Glucose, Bld: 79 mg/dL (ref 70–99)
Potassium: 4.3 mEq/L (ref 3.5–5.3)
Sodium: 140 mEq/L (ref 135–145)
Total Bilirubin: 0.3 mg/dL (ref 0.3–1.2)
Total Protein: 6.9 g/dL (ref 6.0–8.3)

## 2011-11-26 LAB — CBC WITH DIFFERENTIAL/PLATELET
Basophils Absolute: 0 10*3/uL (ref 0.0–0.1)
Basophils Relative: 0 % (ref 0–1)
Eosinophils Absolute: 0.2 10*3/uL (ref 0.0–0.7)
HCT: 38.6 % — ABNORMAL LOW (ref 39.0–52.0)
Hemoglobin: 12.7 g/dL — ABNORMAL LOW (ref 13.0–17.0)
MCH: 27.7 pg (ref 26.0–34.0)
MCHC: 32.9 g/dL (ref 30.0–36.0)
Monocytes Absolute: 0.6 10*3/uL (ref 0.1–1.0)
Monocytes Relative: 9 % (ref 3–12)
Neutro Abs: 2.9 10*3/uL (ref 1.7–7.7)
RDW: 14.3 % (ref 11.5–15.5)

## 2011-11-27 LAB — HIV-1 RNA QUANT-NO REFLEX-BLD: HIV-1 RNA Quant, Log: <1.3 {Log} (ref <1.30)

## 2011-12-01 ENCOUNTER — Telehealth: Payer: Self-pay | Admitting: *Deleted

## 2011-12-01 NOTE — Telephone Encounter (Signed)
[name] with Dr. Ashley Royalty office calling to get [place] [facility] NPI for upcoming eye appointment.  Christiane Ha has never been seen here but we are on his Medicaid card per [employer].   Looks like he is followed at [facility].  Authorization given for one visit and I ask [name] to advise patient to have [place] taken off his Medicaid Card. Ileana Ladd

## 2011-12-03 ENCOUNTER — Ambulatory Visit: Payer: Medicare Other | Admitting: Infectious Disease

## 2011-12-10 ENCOUNTER — Ambulatory Visit (INDEPENDENT_AMBULATORY_CARE_PROVIDER_SITE_OTHER): Payer: Medicare Other | Admitting: Infectious Disease

## 2011-12-10 ENCOUNTER — Encounter: Payer: Self-pay | Admitting: Infectious Disease

## 2011-12-10 VITALS — BP 154/85 | HR 65 | Temp 98.1°F | Ht 69.0 in | Wt 135.8 lb

## 2011-12-10 DIAGNOSIS — Z23 Encounter for immunization: Secondary | ICD-10-CM

## 2011-12-10 DIAGNOSIS — E785 Hyperlipidemia, unspecified: Secondary | ICD-10-CM

## 2011-12-10 DIAGNOSIS — B20 Human immunodeficiency virus [HIV] disease: Secondary | ICD-10-CM

## 2011-12-10 DIAGNOSIS — Z113 Encounter for screening for infections with a predominantly sexual mode of transmission: Secondary | ICD-10-CM

## 2011-12-10 DIAGNOSIS — R634 Abnormal weight loss: Secondary | ICD-10-CM

## 2011-12-10 MED ORDER — MEGESTROL ACETATE 800 MG/20ML PO SUSP: 800.0000 mg | Freq: Every day | ORAL | Status: DC

## 2011-12-10 NOTE — Progress Notes (Signed)
  Subjective:    Patient ID: [name], male    DOB: 1985/09/28, 26 y.o.   MRN: [id number]  HPI  26 year old African American male who had been enrolled in the AIDS clinical trials group trial #5257 where he was receiving Prezista Norvir and Truvada. While in the trial he had perfect virological suppression and healthy CD4 count. This June his viral load jumped to above 17,000 but he has since suppressed. Never admitted to missing any doses prior to these viral load elevation in June. He had had problems with nausea and vomiting per week during the period prior to that neurological failure currently is very well suppressed with healthy CD4 count. He is going to see an ophthalmologist he is trying to plug into a primary care physician. . Review of Systems  Constitutional: Negative for fever, chills, diaphoresis, activity change, appetite change, fatigue and unexpected weight change.  HENT: Negative for congestion, sore throat, rhinorrhea, sneezing, trouble swallowing and sinus pressure.   Eyes: Negative for photophobia and visual disturbance.  Respiratory: Negative for cough, chest tightness, shortness of breath, wheezing and stridor.   Cardiovascular: Negative for chest pain, palpitations and leg swelling.  Gastrointestinal: Negative for nausea, vomiting, abdominal pain, diarrhea, constipation, blood in stool, abdominal distention and anal bleeding.  Genitourinary: Negative for dysuria, hematuria, flank pain and difficulty urinating.  Musculoskeletal: Negative for myalgias, back pain, joint swelling, arthralgias and gait problem.  Skin: Negative for color change, pallor, rash and wound.  Neurological: Negative for dizziness, tremors, weakness and light-headedness.  Hematological: Negative for adenopathy. Does not bruise/bleed easily.  Psychiatric/Behavioral: Negative for behavioral problems, confusion, sleep disturbance, dysphoric mood, decreased concentration and agitation.         Objective:   Physical Exam  Constitutional: He is oriented to person, place, and time. He appears well-developed and well-nourished. No distress.  HENT:  Head: Normocephalic and atraumatic.  Mouth/Throat: Oropharynx is clear and moist. No oropharyngeal exudate.  Eyes: Conjunctivae normal and EOM are normal. Pupils are equal, round, and reactive to light. No scleral icterus.  Neck: Normal range of motion. Neck supple. No JVD present.  Cardiovascular: Normal rate, regular rhythm and normal heart sounds.  Exam reveals no gallop and no friction rub.   No murmur heard. Pulmonary/Chest: Effort normal and breath sounds normal. No respiratory distress. He has no wheezes. He has no rales. He exhibits no tenderness.  Abdominal: He exhibits no distension and no mass. There is no tenderness. There is no rebound and no guarding.  Musculoskeletal: He exhibits no edema and no tenderness.  Lymphadenopathy:    He has no cervical adenopathy.  Neurological: He is alert and oriented to person, place, and time. He has normal reflexes. He exhibits normal muscle tone. Coordination normal.  Skin: Skin is warm and dry. He is not diaphoretic. No erythema. No pallor.  Psychiatric: He has a normal mood and affect. His behavior is normal. Judgment and thought content normal.          Assessment & Plan   HIV: HIV  HIV: Continue Prezista Norvir Truvada  Underweight: Prescribe megestrol

## 2012-02-04 ENCOUNTER — Other Ambulatory Visit: Payer: Self-pay | Admitting: *Deleted

## 2012-02-04 DIAGNOSIS — B20 Human immunodeficiency virus [HIV] disease: Secondary | ICD-10-CM

## 2012-02-04 DIAGNOSIS — Z720 Tobacco use: Secondary | ICD-10-CM

## 2012-02-04 MED ORDER — RITONAVIR 100 MG PO CAPS: 100.0000 mg | ORAL_CAPSULE | Freq: Every day | ORAL | Status: DC

## 2012-02-04 MED ORDER — DARUNAVIR ETHANOLATE 800 MG PO TABS: 800.0000 mg | ORAL_TABLET | Freq: Every day | ORAL | Status: DC

## 2012-02-04 MED ORDER — EMTRICITABINE-TENOFOVIR DF 200-300 MG PO TABS: 1.0000 | ORAL_TABLET | Freq: Every day | ORAL | Status: DC

## 2012-03-16 ENCOUNTER — Encounter (HOSPITAL_COMMUNITY): Payer: Self-pay | Admitting: Emergency Medicine

## 2012-03-16 ENCOUNTER — Emergency Department (HOSPITAL_COMMUNITY)
Admission: EM | Admit: 2012-03-16 | Discharge: 2012-03-16 | Disposition: A | Discharge status: Home / Self Care | Payer: Medicare Other | Attending: Emergency Medicine | Admitting: Emergency Medicine

## 2012-03-16 DIAGNOSIS — Z79899 Other long term (current) drug therapy: Secondary | ICD-10-CM | POA: Insufficient documentation

## 2012-03-16 DIAGNOSIS — K6289 Other specified diseases of anus and rectum: Secondary | ICD-10-CM | POA: Insufficient documentation

## 2012-03-16 DIAGNOSIS — F17200 Nicotine dependence, unspecified, uncomplicated: Secondary | ICD-10-CM | POA: Insufficient documentation

## 2012-03-16 DIAGNOSIS — Z8709 Personal history of other diseases of the respiratory system: Secondary | ICD-10-CM | POA: Insufficient documentation

## 2012-03-16 DIAGNOSIS — Z21 Asymptomatic human immunodeficiency virus [HIV] infection status: Secondary | ICD-10-CM | POA: Insufficient documentation

## 2012-03-16 DIAGNOSIS — J45909 Unspecified asthma, uncomplicated: Secondary | ICD-10-CM | POA: Insufficient documentation

## 2012-03-16 MED ORDER — HYDROCODONE-ACETAMINOPHEN 5-325 MG PO TABS: 1.0000 | ORAL_TABLET | Freq: Four times a day (QID) | ORAL | Status: DC | PRN

## 2012-03-16 MED ORDER — DOXYCYCLINE HYCLATE 100 MG PO CAPS: 100.0000 mg | ORAL_CAPSULE | Freq: Two times a day (BID) | ORAL | Status: DC

## 2012-03-16 MED ORDER — OXYCODONE-ACETAMINOPHEN 5-325 MG PO TABS
1.0000 | ORAL_TABLET | Freq: Once | ORAL | Status: AC
  Administered 2012-03-16: 1 via ORAL
  Filled 2012-03-16: qty 1

## 2012-03-16 NOTE — ED Notes (Signed)
Pt c/o abscess in rectum area; pt sts swelling and pain

## 2012-03-16 NOTE — ED Provider Notes (Signed)
History     CSN: [id number]  Arrival date & time 03/16/12  1009   First MD Initiated Contact with Patient 03/16/12 1232      Chief Complaint  Patient presents with  . Abscess    (Consider location/radiation/quality/duration/timing/severity/associated sxs/prior treatment) Patient is a 27 y.o. male presenting with abscess. The history is provided by the patient (the pt complains of rectal pain). No language interpreter was used.  Abscess Abscess location: rectum. Abscess quality: painful   Red streaking: no   Duration: 2 days. Progression:  Unchanged Pain details:    Quality:  Aching   Timing:  Constant   Progression:  Waxing and waning Chronicity:  New Context: immunosuppression   Associated symptoms: no fatigue and no headaches     Past Medical History  Diagnosis Date  . HIV disease     CD4 488, viral load undetectable on 12/2010  . Bronchitis   . Asthma     Past Surgical History  Procedure Laterality Date  . Hand surgery      Family History  Problem Relation Age of Onset  . Hypertension Mother   . Diabetes Mother     History  Substance Use Topics  . Smoking status: Current Every Day Smoker -- 0.30 packs/day    Types: Cigarettes  . Smokeless tobacco: Never Used  . Alcohol Use: No      Review of Systems  Constitutional: Negative for fatigue.  HENT: Negative for congestion, sinus pressure and ear discharge.   Eyes: Negative for discharge.  Respiratory: Negative for cough.   Cardiovascular: Negative for chest pain.  Gastrointestinal: Positive for rectal pain. Negative for abdominal pain and diarrhea.  Genitourinary: Negative for frequency and hematuria.  Musculoskeletal: Negative for back pain.  Skin: Negative for rash.  Neurological: Negative for seizures and headaches.  Psychiatric/Behavioral: Negative for hallucinations.    Allergies  Review of patient's allergies indicates no known allergies.  Home Medications   Current Outpatient Rx   Name  Route  Sig  Dispense  Refill  . albuterol (PROVENTIL HFA;VENTOLIN HFA) 108 (90 BASE) MCG/ACT inhaler   Inhalation   Inhale 1-2 puffs into the lungs every 6 (six) hours as needed for wheezing.   1 Inhaler   0   . Darunavir Ethanolate (PREZISTA) 800 MG tablet   Oral   Take 1 tablet (800 mg total) by mouth daily with breakfast.   30 tablet   11   . emtricitabine-tenofovir (TRUVADA) 200-300 MG per tablet   Oral   Take 1 tablet by mouth daily.   30 tablet   11   . Megestrol Acetate 800 MG/20ML SUSP   Oral   Take 20 mLs (800 mg total) by mouth daily.   600 mL   4   . ritonavir (NORVIR) 100 MG capsule   Oral   Take 1 capsule (100 mg total) by mouth daily.   30 capsule   11   . doxycycline (VIBRAMYCIN) 100 MG capsule   Oral   Take 1 capsule (100 mg total) by mouth 2 (two) times daily.   20 capsule   0   . HYDROcodone-acetaminophen (NORCO/VICODIN) 5-325 MG per tablet   Oral   Take 1 tablet by mouth every 6 (six) hours as needed for pain.   20 tablet   0     BP 160/107  Pulse 75  Temp(Src) 97.8 F (36.6 C) (Oral)  SpO2 99%  Physical Exam  Constitutional: He is oriented to person, place, and time.  He appears well-developed.  HENT:  Head: Normocephalic and atraumatic.  Eyes: Conjunctivae and EOM are normal. No scleral icterus.  Neck: Neck supple. No thyromegaly present.  Cardiovascular: Normal rate and regular rhythm.  Exam reveals no gallop and no friction rub.   No murmur heard. Pulmonary/Chest: No stridor. He has no wheezes. He has no rales. He exhibits no tenderness.  Abdominal: He exhibits no distension. There is no tenderness. There is no rebound.  Genitourinary:  Tender 1 cm superior to rectum.  No obvious abscess  Musculoskeletal: Normal range of motion. He exhibits no edema.  Lymphadenopathy:    He has no cervical adenopathy.  Neurological: He is oriented to person, place, and time. Coordination normal.  Skin: No rash noted. No erythema.   Psychiatric: He has a normal mood and affect. His behavior is normal.    ED Course  Procedures (including critical care time)  Labs Reviewed - No data to display No results found.   1. Rectal pain       MDM          Benny Lennert, MD 03/16/12 1255

## 2012-03-18 ENCOUNTER — Encounter (HOSPITAL_COMMUNITY): Payer: Self-pay | Admitting: Adult Health

## 2012-03-18 ENCOUNTER — Emergency Department (HOSPITAL_COMMUNITY)
Admission: EM | Admit: 2012-03-18 | Discharge: 2012-03-18 | Disposition: A | Discharge status: Home / Self Care | Payer: Medicare Other | Attending: Emergency Medicine | Admitting: Emergency Medicine

## 2012-03-18 DIAGNOSIS — J45909 Unspecified asthma, uncomplicated: Secondary | ICD-10-CM | POA: Insufficient documentation

## 2012-03-18 DIAGNOSIS — Z79899 Other long term (current) drug therapy: Secondary | ICD-10-CM | POA: Insufficient documentation

## 2012-03-18 DIAGNOSIS — L0291 Cutaneous abscess, unspecified: Secondary | ICD-10-CM

## 2012-03-18 DIAGNOSIS — F17200 Nicotine dependence, unspecified, uncomplicated: Secondary | ICD-10-CM | POA: Insufficient documentation

## 2012-03-18 DIAGNOSIS — Z8709 Personal history of other diseases of the respiratory system: Secondary | ICD-10-CM | POA: Insufficient documentation

## 2012-03-18 DIAGNOSIS — L0231 Cutaneous abscess of buttock: Secondary | ICD-10-CM | POA: Insufficient documentation

## 2012-03-18 DIAGNOSIS — Z21 Asymptomatic human immunodeficiency virus [HIV] infection status: Secondary | ICD-10-CM | POA: Insufficient documentation

## 2012-03-18 MED ORDER — HYDROMORPHONE HCL PF 2 MG/ML IJ SOLN
2.0000 mg | Freq: Once | INTRAMUSCULAR | Status: AC
  Administered 2012-03-18: 2 mg via INTRAMUSCULAR
  Filled 2012-03-18: qty 1

## 2012-03-18 MED ORDER — OXYCODONE-ACETAMINOPHEN 5-325 MG PO TABS: 1.0000 | ORAL_TABLET | ORAL | Status: DC | PRN

## 2012-03-18 MED ORDER — BELLADONNA ALKALOIDS-OPIUM 16.2-60 MG RE SUPP: 60.0000 mg | RECTAL | Status: DC | PRN

## 2012-03-18 MED ORDER — LORAZEPAM 2 MG/ML IJ SOLN
1.0000 mg | Freq: Once | INTRAMUSCULAR | Status: AC
  Administered 2012-03-18: 1 mg via INTRAMUSCULAR
  Filled 2012-03-18: qty 1

## 2012-03-18 NOTE — ED Notes (Signed)
Presents with rectal pain that began 6 days ago, was seen here and sent for follow up with surgeon. Small mass noted to buttock crease. Pt is unable to walk with severe pain.

## 2012-03-18 NOTE — ED Provider Notes (Signed)
History  This chart was scribed for non-physician practitioner working with Doug Sou, MD by Ardeen Jourdain, ED Scribe. This patient was seen in room TR11C/TR11C and the patient's care was started at 1843.  CSN: [id number]  Arrival date & time 03/18/12  1748   First MD Initiated Contact with Patient 03/18/12 1843      Chief Complaint  Patient presents with  . Rectal Pain     The history is provided by the patient. No language interpreter was used.    [name] is a 27 y.o. male with a PMH of HIV who presents to the Emergency Department complaining of rectal pain that began 6 days ago with associated intermittent rectal spasm. He states he was seen here 2 days ago for the pain and was given antibiotics. He states he has been taking them as prescribed with no relief. He states he was supposed to see a specialist for the pain but was unable to make an appointment due to a discrepancy with his medicaid card [facility] address of [email or web address] He denies taking anything for the pain. He denies any fever, chills, nausea and emesis as associated symptoms.    Past Medical History  Diagnosis Date  . HIV disease     CD4 488, viral load undetectable on 12/2010  . Bronchitis   . Asthma     Past Surgical History  Procedure Laterality Date  . Hand surgery      Family History  Problem Relation Age of Onset  . Hypertension Mother   . Diabetes Mother     History  Substance Use Topics  . Smoking status: Current Every Day Smoker -- 0.30 packs/day    Types: Cigarettes  . Smokeless tobacco: Never Used  . Alcohol Use: No      Review of Systems  Constitutional: Negative for fever and chills.  Gastrointestinal: Positive for rectal pain. Negative for nausea, vomiting, diarrhea and constipation.  All other systems reviewed and are negative.    Allergies  Review of patient's allergies indicates no known  allergies.  Home Medications   Current Outpatient Rx  Name  Route  Sig  Dispense  Refill  . albuterol (PROVENTIL HFA;VENTOLIN HFA) 108 (90 BASE) MCG/ACT inhaler   Inhalation   Inhale 1-2 puffs into the lungs every 6 (six) hours as needed for wheezing.   1 Inhaler   0   . Darunavir Ethanolate (PREZISTA) 800 MG tablet   Oral   Take 1 tablet (800 mg total) by mouth daily with breakfast.   30 tablet   11   . doxycycline (VIBRAMYCIN) 100 MG capsule   Oral   Take 1 capsule (100 mg total) by mouth 2 (two) times daily.   20 capsule   0   . emtricitabine-tenofovir (TRUVADA) 200-300 MG per tablet   Oral   Take 1 tablet by mouth daily.   30 tablet   11   . HYDROcodone-acetaminophen (NORCO/VICODIN) 5-325 MG per tablet   Oral   Take 1 tablet by mouth every 6 (six) hours as needed for pain.   20 tablet   0   . Megestrol Acetate 800 MG/20ML SUSP   Oral   Take 20 mLs (800 mg total) by mouth daily.   600 mL   4   . ritonavir (NORVIR) 100 MG capsule   Oral   Take 1 capsule (100 mg total) by mouth daily.   30 capsule  11     Triage Vitals: BP 158/82  Pulse 111  Temp(Src) 97.3 F (36.3 C) (Oral)  Resp 16  SpO2 100%  Physical Exam  Nursing note and vitals reviewed. Constitutional: He is oriented to person, place, and time. He appears well-developed and well-nourished. No distress.  HENT:  Head: Normocephalic and atraumatic.  Eyes: EOM are normal. Pupils are equal, round, and reactive to light.  Neck: Normal range of motion. Neck supple. No tracheal deviation present.  Cardiovascular: Normal rate.   Pulmonary/Chest: Effort normal. No respiratory distress.  Abdominal: Soft. He exhibits no distension.  Musculoskeletal: Normal range of motion. He exhibits no edema.  Neurological: He is alert and oriented to person, place, and time.  Skin: Skin is warm and dry.  Abscess with fluctuance, exquisite TTP superior to rectum consistent with piloidal cyst with pustular head     Psychiatric: He has a normal mood and affect. His behavior is normal.    ED Course  Procedures (including critical care time) INCISION AND DRAINAGE Performed by: Arthor Captain Consent: Verbal consent obtained. Risks and benefits: risks, benefits and alternatives were discussed Type: abscess  Body area: gluteal cleft  Anesthesia: local infiltration  Incision was made with a scalpel.  Local anesthetic: lidocaine 2% with epinephrine  Anesthetic total: 2 ml  Complexity: complex Blunt dissection to break up loculations  Drainage: purulent  Drainage amount: copious  Packing material: 1/4 in iodoform gauze  Patient tolerance: Patient tolerated the procedure well with no immediate complications. Wound flushed thoroughly with sterile saline.    DIAGNOSTIC STUDIES: Oxygen Saturation is 100% on room air, normal by my interpretation.    COORDINATION OF CARE:  7:19 PM: Discussed treatment plan which includes I&D and pain medicaion with pt at bedside and pt agreed to plan.     Labs Reviewed - No data to display No results found.   1. Abscess       MDM  Patient with skin abscess amenable to incision and drainage. Small amount of sterile packing placed.  wound recheck in 2 days. Encouraged home warm soaks and flushing.  Mild signs of cellulitis is surrounding skin.  Will d/c to home.  No antibiotic therapy is indicated. B&O suppositories given for rectal spasm.     I personally performed the services described in this documentation, which was scribed in my presence. The recorded information has been reviewed and is accurate.      Arthor Captain, PA-C 03/19/12 0043  Arthor Captain, PA-C 03/19/12 [phone]

## 2012-03-18 NOTE — ED Notes (Signed)
Pt seen here on 2/18 with perirectal pain. Started abx yesterday. States pain is worsening. Swollen, firm, painful area to area left of rectum.

## 2012-03-19 NOTE — ED Provider Notes (Signed)
Medical screening examination/treatment/procedure(s) were performed by non-physician practitioner and as supervising physician I was immediately available for consultation/collaboration.  Hevin Jeffcoat, MD 03/19/12 0117 

## 2012-06-09 ENCOUNTER — Other Ambulatory Visit: Payer: Medicare Other

## 2012-06-23 ENCOUNTER — Encounter: Payer: Self-pay | Admitting: Infectious Disease

## 2012-06-23 ENCOUNTER — Ambulatory Visit (INDEPENDENT_AMBULATORY_CARE_PROVIDER_SITE_OTHER): Payer: Medicare Other | Admitting: Infectious Disease

## 2012-06-23 VITALS — BP 137/91 | HR 75 | Temp 98.6°F | Ht 69.0 in | Wt 146.0 lb

## 2012-06-23 DIAGNOSIS — R634 Abnormal weight loss: Secondary | ICD-10-CM

## 2012-06-23 DIAGNOSIS — IMO0002 Reserved for concepts with insufficient information to code with codable children: Secondary | ICD-10-CM

## 2012-06-23 DIAGNOSIS — Z8669 Personal history of other diseases of the nervous system and sense organs: Secondary | ICD-10-CM

## 2012-06-23 DIAGNOSIS — A4902 Methicillin resistant Staphylococcus aureus infection, unspecified site: Secondary | ICD-10-CM

## 2012-06-23 DIAGNOSIS — L03011 Cellulitis of right finger: Secondary | ICD-10-CM

## 2012-06-23 DIAGNOSIS — B20 Human immunodeficiency virus [HIV] disease: Secondary | ICD-10-CM

## 2012-06-23 LAB — COMPLETE METABOLIC PANEL WITH GFR
ALT: 19 U/L (ref 0–53)
AST: 19 U/L (ref 0–37)
Alkaline Phosphatase: 76 U/L (ref 39–117)
Sodium: 140 mEq/L (ref 135–145)
Total Bilirubin: 0.3 mg/dL (ref 0.3–1.2)
Total Protein: 7.4 g/dL (ref 6.0–8.3)

## 2012-06-23 LAB — CBC WITH DIFFERENTIAL/PLATELET
Basophils Absolute: 0 10*3/uL (ref 0.0–0.1)
HCT: 39.9 % (ref 39.0–52.0)
Lymphocytes Relative: 52 % — ABNORMAL HIGH (ref 12–46)
Monocytes Absolute: 0.3 10*3/uL (ref 0.1–1.0)
Neutro Abs: 1.9 10*3/uL (ref 1.7–7.7)
Platelets: 221 10*3/uL (ref 150–400)
RDW: 14.6 % (ref 11.5–15.5)
WBC: 5 10*3/uL (ref 4.0–10.5)

## 2012-06-23 MED ORDER — VERAPAMIL HCL ER 120 MG PO TBCR: 120.0000 mg | EXTENDED_RELEASE_TABLET | Freq: Every day | ORAL | Status: DC

## 2012-06-23 MED ORDER — SUMATRIPTAN SUCCINATE 50 MG PO TABS: 50.0000 mg | ORAL_TABLET | ORAL | Status: DC | PRN

## 2012-06-23 MED ORDER — TRAMADOL HCL 50 MG PO TABS: 50.0000 mg | ORAL_TABLET | Freq: Four times a day (QID) | ORAL | Status: DC | PRN

## 2012-06-23 MED ORDER — DOXYCYCLINE HYCLATE 100 MG PO TABS: 100.0000 mg | ORAL_TABLET | Freq: Two times a day (BID) | ORAL | Status: DC

## 2012-06-23 NOTE — Progress Notes (Signed)
Subjective:    Patient ID: [name], male    DOB: August 24, 1985, 27 y.o.   MRN: [id number]  HPI   27 year old African American male who had been enrolled in the AIDS clinical trials group trial #5257 where he was receiving Prezista Norvir and Truvada. While in the trial he had perfect virological suppression and healthy CD4 count. This June his viral load jumped to above 17,000 but he has since suppressed. Never admitted to missing any doses prior to these viral load elevatio June and now re-suppressed on recheck. He has had problems with recurrent MRSA infections and now suffering suffering from paronychia with purulent discharge from his index finger.  Was treated recently with I&D of an abscess on his buttocks in the emergency department treated with doxycycline. Will treat his paronychia again empirically with anti-M. MRSA drugs with doxycycline.  He also in addition to pain at the site of his finger for which he is asking for narcotics has been suffering from migrainous headaches. I'm going to start him on a calcium channel blocker for that along with Imitrex and refer him to neurology.  .Review of Systems  Constitutional: Negative for fever, chills, diaphoresis, activity change, appetite change, fatigue and unexpected weight change.  HENT: Negative for congestion, sore throat, rhinorrhea, sneezing, trouble swallowing and sinus pressure.   Eyes: Negative for photophobia and visual disturbance.  Respiratory: Negative for cough, chest tightness, shortness of breath, wheezing and stridor.   Cardiovascular: Negative for chest pain, palpitations and leg swelling.  Gastrointestinal: Negative for nausea, vomiting, abdominal pain, diarrhea, constipation, blood in stool, abdominal distention and anal bleeding.  Genitourinary: Negative for dysuria, hematuria, flank pain and difficulty urinating.  Musculoskeletal: Positive for arthralgias. Negative for myalgias, back pain, joint swelling and gait  problem.  Skin: Positive for color change. Negative for pallor, rash and wound.  Neurological: Negative for dizziness, tremors, weakness and light-headedness.  Hematological: Negative for adenopathy. Does not bruise/bleed easily.  Psychiatric/Behavioral: Negative for behavioral problems, confusion, sleep disturbance, dysphoric mood, decreased concentration and agitation.       Objective:   Physical Exam  Constitutional: He is oriented to person, place, and time. He appears well-developed and well-nourished. No distress.  HENT:  Head: Normocephalic and atraumatic.  Mouth/Throat: Oropharynx is clear and moist. No oropharyngeal exudate.  Eyes: Conjunctivae and EOM are normal. Pupils are equal, round, and reactive to light. No scleral icterus.  Neck: Normal range of motion. Neck supple. No JVD present.  Cardiovascular: Normal rate, regular rhythm and normal heart sounds.  Exam reveals no gallop and no friction rub.   No murmur heard. Pulmonary/Chest: Effort normal and breath sounds normal. No respiratory distress. He has no wheezes. He has no rales. He exhibits no tenderness.  Abdominal: He exhibits no distension and no mass. There is no tenderness. There is no rebound and no guarding.  Musculoskeletal: He exhibits no edema and no tenderness.       Arms: Lymphadenopathy:    He has no cervical adenopathy.  Neurological: He is alert and oriented to person, place, and time. He has normal reflexes. He exhibits normal muscle tone. Coordination normal.  Skin: Skin is warm and dry. He is not diaphoretic. No erythema. No pallor.  Psychiatric: He has a normal mood and affect. His behavior is normal. Judgment and thought content normal.          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  Finger pain given Ultram for now.

## 2012-06-24 LAB — HIV-1 RNA QUANT-NO REFLEX-BLD
HIV 1 RNA Quant: 43 copies/mL — ABNORMAL HIGH (ref <20)
HIV-1 RNA Quant, Log: 1.63 {Log} — ABNORMAL HIGH (ref <1.30)

## 2012-06-24 LAB — T-HELPER CELL (CD4) - (RCID CLINIC ONLY): CD4 % Helper T Cell: 23 % — ABNORMAL LOW (ref 33–55)

## 2012-06-25 ENCOUNTER — Telehealth: Payer: Self-pay | Admitting: *Deleted

## 2012-06-25 NOTE — Telephone Encounter (Signed)
Referral request faxed to [facility] [phone]. Andree Coss, RN

## 2012-06-30 ENCOUNTER — Encounter (HOSPITAL_COMMUNITY): Payer: Self-pay | Admitting: Emergency Medicine

## 2012-06-30 ENCOUNTER — Emergency Department (HOSPITAL_COMMUNITY)
Admission: EM | Admit: 2012-06-30 | Discharge: 2012-06-30 | Disposition: A | Discharge status: Home / Self Care | Payer: Medicare HMO | Attending: Emergency Medicine | Admitting: Emergency Medicine

## 2012-06-30 DIAGNOSIS — I10 Essential (primary) hypertension: Secondary | ICD-10-CM | POA: Insufficient documentation

## 2012-06-30 DIAGNOSIS — Z21 Asymptomatic human immunodeficiency virus [HIV] infection status: Secondary | ICD-10-CM | POA: Insufficient documentation

## 2012-06-30 DIAGNOSIS — N498 Inflammatory disorders of other specified male genital organs: Secondary | ICD-10-CM | POA: Insufficient documentation

## 2012-06-30 DIAGNOSIS — L0291 Cutaneous abscess, unspecified: Secondary | ICD-10-CM

## 2012-06-30 DIAGNOSIS — J45909 Unspecified asthma, uncomplicated: Secondary | ICD-10-CM | POA: Insufficient documentation

## 2012-06-30 DIAGNOSIS — F17200 Nicotine dependence, unspecified, uncomplicated: Secondary | ICD-10-CM | POA: Insufficient documentation

## 2012-06-30 DIAGNOSIS — Z79899 Other long term (current) drug therapy: Secondary | ICD-10-CM | POA: Insufficient documentation

## 2012-06-30 HISTORY — DX: Essential (primary) hypertension: I10

## 2012-06-30 MED ORDER — HYDROCODONE-ACETAMINOPHEN 5-325 MG PO TABS
2.0000 | ORAL_TABLET | Freq: Once | ORAL | Status: AC
  Administered 2012-06-30: 2 via ORAL
  Filled 2012-06-30: qty 2

## 2012-06-30 NOTE — ED Provider Notes (Signed)
History     CSN: [id number]  Arrival date & time 06/30/12  0727   First MD Initiated Contact with Patient 06/30/12 [phone]      Chief Complaint  Patient presents with  . Recurrent Skin Infections    (Consider location/radiation/quality/duration/timing/severity/associated sxs/prior treatment) The history is provided by the patient.  pt c/o abscess near base left side of scrotum for the past 3-4 days. Gradual onset, constant, slowly getting larger. No testicular pain. No abd pain. No nv. No fever or chills. Does not feel ill or sick. Constant, dull, mod pain to area, worse w palpation. Hx same.     Past Medical History  Diagnosis Date  . HIV disease     CD4 488, viral load undetectable on 12/2010  . Bronchitis   . Asthma   . Hypertension     Past Surgical History  Procedure Laterality Date  . Hand surgery      Family History  Problem Relation Age of Onset  . Hypertension Mother   . Diabetes Mother     History  Substance Use Topics  . Smoking status: Current Every Day Smoker -- 0.30 packs/day    Types: Cigarettes  . Smokeless tobacco: Never Used  . Alcohol Use: No      Review of Systems  Constitutional: Negative for fever and chills.  HENT: Negative for neck pain.   Eyes: Negative for redness.  Respiratory: Negative for cough and shortness of breath.   Cardiovascular: Negative for chest pain.  Gastrointestinal: Negative for nausea, vomiting and abdominal pain.  Genitourinary: Negative for flank pain.  Musculoskeletal: Negative for back pain.  Skin: Negative for rash.  Neurological: Negative for headaches.  Hematological: Does not bruise/bleed easily.  Psychiatric/Behavioral: Negative for confusion.    Allergies  Review of patient's allergies indicates no known allergies.  Home Medications   Current Outpatient Rx  Name  Route  Sig  Dispense  Refill  . albuterol (PROVENTIL HFA;VENTOLIN HFA) 108 (90 BASE) MCG/ACT inhaler   Inhalation   Inhale 1-2 puffs  into the lungs every 6 (six) hours as needed for wheezing.   1 Inhaler   0   . Darunavir Ethanolate (PREZISTA) 800 MG tablet   Oral   Take 1 tablet (800 mg total) by mouth daily with breakfast.   30 tablet   11   . doxycycline (VIBRA-TABS) 100 MG tablet   Oral   Take 1 tablet (100 mg total) by mouth 2 (two) times daily.   28 tablet   1   . emtricitabine-tenofovir (TRUVADA) 200-300 MG per tablet   Oral   Take 1 tablet by mouth daily.   30 tablet   11   . Megestrol Acetate 800 MG/20ML SUSP   Oral   Take 20 mLs (800 mg total) by mouth daily.   600 mL   4   . ritonavir (NORVIR) 100 MG capsule   Oral   Take 1 capsule (100 mg total) by mouth daily.   30 capsule   11   . SUMAtriptan (IMITREX) 50 MG tablet   Oral   Take 1 tablet (50 mg total) by mouth every 2 (two) hours as needed for migraine.   20 tablet   4     Take one two tablets for migraine, if no relieve m ...   . traMADol (ULTRAM) 50 MG tablet   Oral   Take 1 tablet (50 mg total) by mouth every 6 (six) hours as needed for pain.   56  tablet   0   . verapamil (CALAN-SR) 120 MG CR tablet   Oral   Take 1 tablet (120 mg total) by mouth at bedtime.   30 tablet   11     BP 131/75  Pulse 82  Temp(Src) 98.3 F (36.8 C) (Oral)  Resp 18  SpO2 98%  Physical Exam  Nursing note and vitals reviewed. Constitutional: He is oriented to person, place, and time. He appears well-developed and well-nourished. No distress.  HENT:  Head: Atraumatic.  Eyes: Conjunctivae are normal.  Neck: Neck supple. No tracheal deviation present.  Cardiovascular: Normal rate.   Pulmonary/Chest: Effort normal. No accessory muscle usage. No respiratory distress.  Abdominal: Soft. He exhibits no distension. There is no tenderness.  Musculoskeletal: Normal range of motion. He exhibits no edema.  Neurological: He is alert and oriented to person, place, and time.  Skin: Skin is warm and dry.  3-4 cm diameter abscess left perineal  area/lbase of left side scrotum. Does not extend up into scrotum/testicle. No cellulitis. No crepitus. Pain/tenderness localized to abscess which comes to head, no drainage.     Psychiatric: He has a normal mood and affect.    ED Course  Procedures (including critical care time)     MDM   Reviewed nursing notes and prior charts for additional history.   Pt states has ride, did not drive. No meds pta.  vicodin po for pain.  INCISION AND DRAINAGE Performed by: Suzi Roots Consent: Verbal consent obtained. Risks and benefits: risks, benefits and alternatives were discussed Type: abscess  Body area: perineal/base of left side scrotum  Anesthesia: local infiltration  Incision was made with a scalpel.  Local anesthetic: lidocaine 2% w epinephrine  Anesthetic total: 4 ml  Complexity: complex Blunt dissection to break up loculations  Drainage: purulent  Drainage amount: moderate, purulent  Packing material: none needed, elliptical incision  Patient tolerance: Patient tolerated the procedure well with no immediate complications.      Sterile dressing applied. Pt tolerated procedure well.   Hx same. Does not feel ill or sick. No nv. No fever or chills. No cellulitis.   Pt stable for d/c.      Suzi Roots, MD 06/30/12 [phone]

## 2012-06-30 NOTE — ED Notes (Signed)
Has a boil between testicle and rectum x 4 days has gotten bigger

## 2012-06-30 NOTE — ED Notes (Signed)
Patient is alert and orientedx4.  Patient was explained discharge instructions and they understood them with no questions.  The patient's cousin, Romualdo Bolk, is here to take the patient home.

## 2012-07-14 ENCOUNTER — Telehealth: Payer: Self-pay | Admitting: Licensed Clinical Social Worker

## 2012-07-14 NOTE — Telephone Encounter (Signed)
Patient called requesting Vicodin for his headache, he states the Imitrex is not working effectively and he the Vicodin helped in the past. 

## 2012-07-14 NOTE — Telephone Encounter (Signed)
Patient called requesting Vicodin for his headache, he states the Imitrex is not working effectively and he the Vicodin helped in the past.

## 2012-07-14 NOTE — Telephone Encounter (Signed)
I am ok with it ON the condition that he see a Neurologist for workup and rx of his headaches

## 2012-07-15 ENCOUNTER — Other Ambulatory Visit: Payer: Self-pay | Admitting: Licensed Clinical Social Worker

## 2012-07-15 DIAGNOSIS — R51 Headache: Secondary | ICD-10-CM

## 2012-07-15 MED ORDER — HYDROCODONE-ACETAMINOPHEN 5-325 MG PO TABS: 2.0000 | ORAL_TABLET | ORAL | Status: AC | PRN

## 2012-07-22 ENCOUNTER — Other Ambulatory Visit: Payer: Self-pay | Admitting: Infectious Disease

## 2012-07-28 ENCOUNTER — Ambulatory Visit: Payer: Medicare Other | Admitting: Neurology

## 2012-07-29 ENCOUNTER — Telehealth: Payer: Self-pay | Admitting: *Deleted

## 2012-07-29 NOTE — Telephone Encounter (Signed)
Received notification from [facility] that patient no-showed his referral appointment. Andree Coss, RN

## 2012-07-29 NOTE — Telephone Encounter (Signed)
Vicodin should be dc'd from his med list then. His making appt with Neurology for workup and management of his Headaches  was precondition to my prescribing it

## 2012-08-11 NOTE — Telephone Encounter (Signed)
Done

## 2012-08-25 ENCOUNTER — Telehealth: Payer: Self-pay | Admitting: *Deleted

## 2012-08-25 NOTE — Telephone Encounter (Signed)
Patient called requesting a refill on vicodin, he was given this pending his referral to Neuro. He said he had to cancel the appt, but we have a phone note stating that [facility] said he no showed. Advised the patient he needed to reschedule his appt with Neuro. Vicodin was denied per Dr. Daiva Eves. Wendall Mola

## 2012-10-11 ENCOUNTER — Other Ambulatory Visit: Payer: Medicare Other

## 2012-10-25 ENCOUNTER — Other Ambulatory Visit (HOSPITAL_COMMUNITY)
Admission: RE | Admit: 2012-10-25 | Discharge: 2012-10-25 | Disposition: A | Discharge status: Home / Self Care | Payer: Medicare HMO | Source: Ambulatory Visit | Attending: Infectious Disease | Admitting: Infectious Disease

## 2012-10-25 ENCOUNTER — Encounter: Payer: Self-pay | Admitting: Infectious Disease

## 2012-10-25 ENCOUNTER — Ambulatory Visit (INDEPENDENT_AMBULATORY_CARE_PROVIDER_SITE_OTHER): Payer: Medicare HMO | Admitting: Infectious Disease

## 2012-10-25 VITALS — BP 137/82 | HR 79 | Temp 98.3°F | Wt 149.0 lb

## 2012-10-25 DIAGNOSIS — B20 Human immunodeficiency virus [HIV] disease: Secondary | ICD-10-CM

## 2012-10-25 DIAGNOSIS — Z8669 Personal history of other diseases of the nervous system and sense organs: Secondary | ICD-10-CM

## 2012-10-25 DIAGNOSIS — Z113 Encounter for screening for infections with a predominantly sexual mode of transmission: Secondary | ICD-10-CM | POA: Insufficient documentation

## 2012-10-25 DIAGNOSIS — N529 Male erectile dysfunction, unspecified: Secondary | ICD-10-CM

## 2012-10-25 DIAGNOSIS — Z0000 Encounter for general adult medical examination without abnormal findings: Secondary | ICD-10-CM

## 2012-10-25 DIAGNOSIS — I10 Essential (primary) hypertension: Secondary | ICD-10-CM

## 2012-10-25 DIAGNOSIS — Z23 Encounter for immunization: Secondary | ICD-10-CM

## 2012-10-25 LAB — CBC WITH DIFFERENTIAL/PLATELET
Basophils Absolute: 0 10*3/uL (ref 0.0–0.1)
Eosinophils Relative: 2 % (ref 0–5)
HCT: 38.1 % — ABNORMAL LOW (ref 39.0–52.0)
Hemoglobin: 12.3 g/dL — ABNORMAL LOW (ref 13.0–17.0)
Lymphocytes Relative: 51 % — ABNORMAL HIGH (ref 12–46)
MCHC: 32.3 g/dL (ref 30.0–36.0)
MCV: 88.8 fL (ref 78.0–100.0)
Monocytes Absolute: 0.4 10*3/uL (ref 0.1–1.0)
Monocytes Relative: 5 % (ref 3–12)
RDW: 14.9 % (ref 11.5–15.5)
WBC: 7.5 10*3/uL (ref 4.0–10.5)

## 2012-10-25 LAB — COMPLETE METABOLIC PANEL WITH GFR
AST: 16 U/L (ref 0–37)
Albumin: 4.7 g/dL (ref 3.5–5.2)
Alkaline Phosphatase: 66 U/L (ref 39–117)
Potassium: 4.2 mEq/L (ref 3.5–5.3)
Sodium: 142 mEq/L (ref 135–145)
Total Bilirubin: 0.2 mg/dL — ABNORMAL LOW (ref 0.3–1.2)
Total Protein: 7.6 g/dL (ref 6.0–8.3)

## 2012-10-25 LAB — PSA: PSA: 0.56 ng/mL (ref <=4.00)

## 2012-10-25 LAB — RPR

## 2012-10-25 MED ORDER — SILDENAFIL CITRATE 50 MG PO TABS: 50.0000 mg | ORAL_TABLET | Freq: Every day | ORAL | Status: DC | PRN

## 2012-10-25 NOTE — Progress Notes (Signed)
Subjective:    Patient ID: [name], male    DOB: 08/28/1985, 27 y.o.   MRN: [id number]  HPI   27 year old African American male who had been enrolled in the AIDS clinical trials group trial #5257 where he was receiving Prezista Norvir and Truvada. While in the trial he had perfect virological suppression and healthy CD4 count. This June 2013 his viral load jumped to above 17,000 but he has since suppressed. Never admitted to missing any doses prior to these viral load elevatio June and now re-suppressed on recheck  He has been started on calcium channel blocker long-acting breath no to help with his migrainous headaches and hypertension. This has improved his blood pressure control.  He is new to complaints of problems with erectile dysfunction for the last 3 months and asked about Viagra prescription. He states that he uses condoms all forms of sexual intercourse. I told him I wouldn't prescribe this on the condition that he remain undetectable and that he use condoms and routinely screened for STDs.  .Review of Systems  Constitutional: Negative for fever, chills, diaphoresis, activity change, appetite change, fatigue and unexpected weight change.  HENT: Negative for congestion, sore throat, rhinorrhea, sneezing, trouble swallowing and sinus pressure.   Eyes: Negative for photophobia and visual disturbance.  Respiratory: Negative for cough, chest tightness, shortness of breath, wheezing and stridor.   Cardiovascular: Negative for chest pain, palpitations and leg swelling.  Gastrointestinal: Negative for nausea, vomiting, abdominal pain, diarrhea, constipation, blood in stool, abdominal distention and anal bleeding.  Genitourinary: Negative for dysuria, hematuria, flank pain and difficulty urinating.  Musculoskeletal: Negative for myalgias, back pain, joint swelling, arthralgias and gait problem.  Skin: Negative for color change, pallor, rash and wound.  Neurological: Negative for  dizziness, tremors, weakness and light-headedness.  Hematological: Negative for adenopathy. Does not bruise/bleed easily.  Psychiatric/Behavioral: Negative for behavioral problems, confusion, sleep disturbance, dysphoric mood, decreased concentration and agitation.       Objective:   Physical Exam  Constitutional: He is oriented to person, place, and time. He appears well-developed and well-nourished. No distress.  HENT:  Head: Normocephalic and atraumatic.  Mouth/Throat: Oropharynx is clear and moist. No oropharyngeal exudate.  Eyes: Conjunctivae and EOM are normal. Pupils are equal, round, and reactive to light. No scleral icterus.  Neck: Normal range of motion. Neck supple. No JVD present.  Cardiovascular: Normal rate, regular rhythm and normal heart sounds.  Exam reveals no gallop and no friction rub.   No murmur heard. Pulmonary/Chest: Effort normal and breath sounds normal. No respiratory distress. He has no wheezes. He has no rales. He exhibits no tenderness.  Abdominal: He exhibits no distension and no mass. There is no tenderness. There is no rebound and no guarding.  Musculoskeletal: He exhibits no edema and no tenderness.  Lymphadenopathy:    He has no cervical adenopathy.  Neurological: He is alert and oriented to person, place, and time. He exhibits normal muscle tone. Coordination normal.  Skin: Skin is warm and dry. He is not diaphoretic. No erythema. No pallor.  Psychiatric: He has a normal mood and affect. His behavior is normal. Judgment and thought content normal.          Assessment & Plan   HIV: HIV   HIV: Continue Prezista Norvir Truvada,   Migraine headaches continue calcium channel blocker and prescribed Imitrex.  HTN: continue calcium channel longer checked microalbumin to creatinine ratio.  Erectile dysfunction check testosterone okay to give via ground asked him to use  half dose initially to interaction with ritonavir.  Need for influenza  vaccination flu vaccine given.

## 2012-10-26 LAB — MICROALBUMIN / CREATININE URINE RATIO
Creatinine, Urine: 392.6 mg/dL
Microalb Creat Ratio: 4.8 mg/g (ref 0.0–30.0)

## 2012-10-26 LAB — HIV-1 RNA QUANT-NO REFLEX-BLD: HIV 1 RNA Quant: <20 copies/mL (ref <20)

## 2012-10-26 LAB — TESTOSTERONE: Testosterone: 426 ng/dL (ref 300–890)

## 2012-10-27 LAB — HEPATITIS C RNA QUANTITATIVE

## 2012-11-01 ENCOUNTER — Telehealth: Payer: Self-pay | Admitting: *Deleted

## 2012-11-01 NOTE — Telephone Encounter (Signed)
Faxed application to [employer] for Viagra today.

## 2012-11-10 ENCOUNTER — Telehealth: Payer: Self-pay | Admitting: *Deleted

## 2012-11-10 NOTE — Telephone Encounter (Signed)
Called and checked status on [name]'s application to [employer].  It was approved until 01-26-13.  I called and told Christiane Ha that he has been approved .  I told him one of the nurses will call when his Viagra get here.

## 2013-01-10 ENCOUNTER — Telehealth: Payer: Self-pay | Admitting: *Deleted

## 2013-01-10 NOTE — Telephone Encounter (Signed)
Reordered [name]'s Viagra today.  It should be received within the next 7-10 business days.

## 2013-01-17 ENCOUNTER — Telehealth: Payer: Self-pay | Admitting: *Deleted

## 2013-01-17 NOTE — Telephone Encounter (Signed)
Attempted to call patient to let him know that his Viagra is here in office waiting for pick up.  His phone is not accepting incoming calls at this time. Viagra 50mg  #30 Lot [id number] Exp 08/27/14 Andree Coss, RN

## 2013-02-28 ENCOUNTER — Other Ambulatory Visit: Payer: Self-pay | Admitting: Infectious Disease

## 2013-02-28 ENCOUNTER — Telehealth: Payer: Self-pay

## 2013-02-28 DIAGNOSIS — B20 Human immunodeficiency virus [HIV] disease: Secondary | ICD-10-CM

## 2013-02-28 MED ORDER — DARUNAVIR ETHANOLATE 800 MG PO TABS: ORAL_TABLET | ORAL | Status: DC

## 2013-02-28 MED ORDER — RITONAVIR 100 MG PO TABS: 100.0000 mg | ORAL_TABLET | Freq: Every day | ORAL | Status: DC

## 2013-02-28 MED ORDER — EMTRICITABINE-TENOFOVIR DF 200-300 MG PO TABS: 30.0000 | ORAL_TABLET | Freq: Every day | ORAL | Status: DC

## 2013-02-28 NOTE — Telephone Encounter (Signed)
Med refill request

## 2013-03-02 ENCOUNTER — Other Ambulatory Visit: Payer: Self-pay | Admitting: Licensed Clinical Social Worker

## 2013-03-02 ENCOUNTER — Other Ambulatory Visit: Payer: Self-pay | Admitting: *Deleted

## 2013-03-02 DIAGNOSIS — N529 Male erectile dysfunction, unspecified: Secondary | ICD-10-CM

## 2013-03-02 DIAGNOSIS — B20 Human immunodeficiency virus [HIV] disease: Secondary | ICD-10-CM

## 2013-03-02 MED ORDER — SILDENAFIL CITRATE 50 MG PO TABS: 50.0000 mg | ORAL_TABLET | Freq: Every day | ORAL | Status: DC | PRN

## 2013-03-03 ENCOUNTER — Telehealth: Payer: Self-pay | Admitting: *Deleted

## 2013-03-03 NOTE — Telephone Encounter (Signed)
Faxed application to [employer] [facility] for [facility] viagra today.

## 2013-03-08 ENCOUNTER — Telehealth: Payer: Self-pay | Admitting: *Deleted

## 2013-03-08 NOTE — Telephone Encounter (Signed)
Called [facility] to check status on [name]'s application.  It has not been processed yet.  I was told it now takes up to 2 weeks for the process to be completed.

## 2013-03-09 ENCOUNTER — Emergency Department (HOSPITAL_COMMUNITY): Payer: Medicare HMO

## 2013-03-09 ENCOUNTER — Emergency Department (HOSPITAL_COMMUNITY)
Admission: EM | Admit: 2013-03-09 | Discharge: 2013-03-09 | Disposition: A | Discharge status: Home / Self Care | Payer: Medicare HMO | Attending: Emergency Medicine | Admitting: Emergency Medicine

## 2013-03-09 ENCOUNTER — Encounter (HOSPITAL_COMMUNITY): Payer: Self-pay | Admitting: Emergency Medicine

## 2013-03-09 DIAGNOSIS — F17200 Nicotine dependence, unspecified, uncomplicated: Secondary | ICD-10-CM | POA: Insufficient documentation

## 2013-03-09 DIAGNOSIS — M5431 Sciatica, right side: Secondary | ICD-10-CM

## 2013-03-09 DIAGNOSIS — R51 Headache: Secondary | ICD-10-CM | POA: Insufficient documentation

## 2013-03-09 DIAGNOSIS — J45909 Unspecified asthma, uncomplicated: Secondary | ICD-10-CM | POA: Insufficient documentation

## 2013-03-09 DIAGNOSIS — Z21 Asymptomatic human immunodeficiency virus [HIV] infection status: Secondary | ICD-10-CM | POA: Insufficient documentation

## 2013-03-09 DIAGNOSIS — I10 Essential (primary) hypertension: Secondary | ICD-10-CM | POA: Insufficient documentation

## 2013-03-09 DIAGNOSIS — R519 Headache, unspecified: Secondary | ICD-10-CM

## 2013-03-09 DIAGNOSIS — Z79899 Other long term (current) drug therapy: Secondary | ICD-10-CM | POA: Insufficient documentation

## 2013-03-09 DIAGNOSIS — M5430 Sciatica, unspecified side: Secondary | ICD-10-CM | POA: Insufficient documentation

## 2013-03-09 DIAGNOSIS — G8911 Acute pain due to trauma: Secondary | ICD-10-CM | POA: Insufficient documentation

## 2013-03-09 LAB — CBC WITH DIFFERENTIAL/PLATELET
BASOS PCT: 0 % (ref 0–1)
Basophils Absolute: 0 10*3/uL (ref 0.0–0.1)
Eosinophils Absolute: 0.2 10*3/uL (ref 0.0–0.7)
Eosinophils Relative: 3 % (ref 0–5)
HEMATOCRIT: 39.3 % (ref 39.0–52.0)
Hemoglobin: 13.3 g/dL (ref 13.0–17.0)
LYMPHS PCT: 54 % — AB (ref 12–46)
Lymphs Abs: 4.2 10*3/uL — ABNORMAL HIGH (ref 0.7–4.0)
MCH: 29.6 pg (ref 26.0–34.0)
MCHC: 33.8 g/dL (ref 30.0–36.0)
MCV: 87.5 fL (ref 78.0–100.0)
MONO ABS: 0.4 10*3/uL (ref 0.1–1.0)
Monocytes Relative: 5 % (ref 3–12)
NEUTROS PCT: 38 % — AB (ref 43–77)
Neutro Abs: 2.9 10*3/uL (ref 1.7–7.7)
Platelets: 183 10*3/uL (ref 150–400)
RBC: 4.49 MIL/uL (ref 4.22–5.81)
RDW: 14 % (ref 11.5–15.5)
WBC: 7.7 10*3/uL (ref 4.0–10.5)

## 2013-03-09 LAB — URINE MICROSCOPIC-ADD ON

## 2013-03-09 LAB — BASIC METABOLIC PANEL
BUN: 9 mg/dL (ref 6–23)
CHLORIDE: 105 meq/L (ref 96–112)
CO2: 25 mEq/L (ref 19–32)
CREATININE: 0.8 mg/dL (ref 0.50–1.35)
Calcium: 9.1 mg/dL (ref 8.4–10.5)
GFR calc non Af Amer: >90 mL/min (ref >90)
Glucose, Bld: 97 mg/dL (ref 70–99)
Potassium: 4.1 mEq/L (ref 3.7–5.3)
SODIUM: 142 meq/L (ref 137–147)

## 2013-03-09 LAB — URINALYSIS, ROUTINE W REFLEX MICROSCOPIC
BILIRUBIN URINE: NEGATIVE
Glucose, UA: NEGATIVE mg/dL
Hgb urine dipstick: NEGATIVE
Ketones, ur: NEGATIVE mg/dL
Leukocytes, UA: NEGATIVE
Nitrite: NEGATIVE
Protein, ur: NEGATIVE mg/dL
SPECIFIC GRAVITY, URINE: 1.027 (ref 1.005–1.030)
UROBILINOGEN UA: 1 mg/dL (ref 0.0–1.0)
pH: 8 (ref 5.0–8.0)

## 2013-03-09 MED ORDER — HYDROMORPHONE HCL PF 1 MG/ML IJ SOLN
1.0000 mg | Freq: Once | INTRAMUSCULAR | Status: AC
  Administered 2013-03-09: 1 mg via INTRAVENOUS
  Filled 2013-03-09: qty 1

## 2013-03-09 MED ORDER — SODIUM CHLORIDE 0.9 % IV SOLN
INTRAVENOUS | Status: DC
  Administered 2013-03-09: 19:00:00 via INTRAVENOUS

## 2013-03-09 MED ORDER — PROMETHAZINE HCL 25 MG PO TABS: 25.0000 mg | ORAL_TABLET | Freq: Four times a day (QID) | ORAL | Status: DC | PRN

## 2013-03-09 MED ORDER — ONDANSETRON HCL 4 MG/2ML IJ SOLN
4.0000 mg | Freq: Once | INTRAMUSCULAR | Status: AC
  Administered 2013-03-09: 4 mg via INTRAVENOUS
  Filled 2013-03-09: qty 2

## 2013-03-09 MED ORDER — HYDROCODONE-ACETAMINOPHEN 5-325 MG PO TABS: 1.0000 | ORAL_TABLET | Freq: Four times a day (QID) | ORAL | Status: DC | PRN

## 2013-03-09 NOTE — Discharge Instructions (Signed)
Make an appointment to followup with infectious disease earlier than scheduled if possible. Resource guide below provided to give you a place to followup for the back pain. On the resource guide you will see listing for [place] recommend that you followup with them in the meantime. Return for any new or worse symptoms. Surgically return for any fevers. Take pain medicine as directed and take antinausea medicine as directed. Today's head CT and x-rays of the back were negative.

## 2013-03-09 NOTE — ED Notes (Signed)
EDP Zackowski notified that pt was having intermittent periods of vision loss.
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
## 2013-03-09 NOTE — ED Notes (Signed)
Pt given d/c instructions and verbalized understanding. Pt rode in a wheel chair to the waiting room to wait for ride. Pt verbalized understanding not to stand up alone.

## 2013-03-09 NOTE — ED Notes (Signed)
Pt presents to department for evaluation of lower back pain, dysuria and headache. Ongoing x2 days. 9/10 pain at the time. Pt is alert and oriented x4.

## 2013-03-17 ENCOUNTER — Telehealth: Payer: Self-pay | Admitting: *Deleted

## 2013-03-17 NOTE — Telephone Encounter (Signed)
Received call from [name] wanting to know about his viagra.  I called [facility].  His application has been approved and the shipment is in process.  Medication should be received within the next  7-10 business days.  I called [name] and told him.  He was not satisfied with the response.  I gave him the number to [facility] and what options to take so he can call and find out when the medicine will be here.

## 2013-03-21 ENCOUNTER — Telehealth: Payer: Self-pay | Admitting: *Deleted

## 2013-03-21 NOTE — Telephone Encounter (Signed)
Medication has arrived for pt pick-up.  Pt verbalized understanding.

## 2013-03-21 NOTE — Telephone Encounter (Signed)
[name] came by and picked up his medication today.

## 2013-03-21 NOTE — Telephone Encounter (Signed)
Received another call from Christiane HaJonathan wanting to know about his Viagra.  I told him we are still waiting for it to come.  If he does not hear from me today to call me back tomorrow and I will call [facility] tomorrow.

## 2013-04-25 ENCOUNTER — Other Ambulatory Visit: Payer: Medicare HMO

## 2013-04-25 ENCOUNTER — Other Ambulatory Visit: Payer: Medicare Other

## 2013-04-25 ENCOUNTER — Other Ambulatory Visit (HOSPITAL_COMMUNITY)
Admission: RE | Admit: 2013-04-25 | Discharge: 2013-04-25 | Disposition: A | Discharge status: Home / Self Care | Payer: Medicare HMO | Source: Ambulatory Visit | Attending: Infectious Disease | Admitting: Infectious Disease

## 2013-04-25 DIAGNOSIS — E782 Mixed hyperlipidemia: Secondary | ICD-10-CM

## 2013-04-25 DIAGNOSIS — Z113 Encounter for screening for infections with a predominantly sexual mode of transmission: Secondary | ICD-10-CM | POA: Insufficient documentation

## 2013-04-25 DIAGNOSIS — B20 Human immunodeficiency virus [HIV] disease: Secondary | ICD-10-CM

## 2013-04-25 LAB — COMPLETE METABOLIC PANEL WITH GFR
ALBUMIN: 4.4 g/dL (ref 3.5–5.2)
ALT: 31 U/L (ref 0–53)
AST: 22 U/L (ref 0–37)
Alkaline Phosphatase: 63 U/L (ref 39–117)
BILIRUBIN TOTAL: 0.3 mg/dL (ref 0.2–1.2)
BUN: 9 mg/dL (ref 6–23)
CO2: 24 meq/L (ref 19–32)
Calcium: 9.2 mg/dL (ref 8.4–10.5)
Chloride: 108 mEq/L (ref 96–112)
Creat: 0.84 mg/dL (ref 0.50–1.35)
Glucose, Bld: 78 mg/dL (ref 70–99)
Potassium: 4.8 mEq/L (ref 3.5–5.3)
SODIUM: 140 meq/L (ref 135–145)
Total Protein: 7.3 g/dL (ref 6.0–8.3)

## 2013-04-25 LAB — CBC WITH DIFFERENTIAL/PLATELET
Basophils Absolute: 0 10*3/uL (ref 0.0–0.1)
Basophils Relative: 0 % (ref 0–1)
EOS ABS: 0.1 10*3/uL (ref 0.0–0.7)
Eosinophils Relative: 2 % (ref 0–5)
HCT: 39.7 % (ref 39.0–52.0)
HEMOGLOBIN: 13.3 g/dL (ref 13.0–17.0)
LYMPHS PCT: 63 % — AB (ref 12–46)
Lymphs Abs: 3.7 10*3/uL (ref 0.7–4.0)
MCH: 29.2 pg (ref 26.0–34.0)
MCHC: 33.5 g/dL (ref 30.0–36.0)
MCV: 87.3 fL (ref 78.0–100.0)
Monocytes Absolute: 0.4 10*3/uL (ref 0.1–1.0)
Monocytes Relative: 7 % (ref 3–12)
NEUTROS PCT: 28 % — AB (ref 43–77)
Neutro Abs: 1.6 10*3/uL — ABNORMAL LOW (ref 1.7–7.7)
PLATELETS: 242 10*3/uL (ref 150–400)
RBC: 4.55 MIL/uL (ref 4.22–5.81)
RDW: 14.3 % (ref 11.5–15.5)
WBC: 5.8 10*3/uL (ref 4.0–10.5)

## 2013-04-25 LAB — LIPID PANEL
CHOL/HDL RATIO: 4.5 ratio
Cholesterol: 136 mg/dL (ref 0–200)
HDL: 30 mg/dL — AB (ref >39)
LDL CALC: 92 mg/dL (ref 0–99)
Triglycerides: 72 mg/dL (ref <150)
VLDL: 14 mg/dL (ref 0–40)

## 2013-04-26 LAB — T-HELPER CELL (CD4) - (RCID CLINIC ONLY)
CD4 T CELL ABS: 950 /uL (ref 400–2700)
CD4 T CELL HELPER: 25 % — AB (ref 33–55)

## 2013-04-26 LAB — HIV-1 RNA QUANT-NO REFLEX-BLD

## 2013-04-26 LAB — URINE CYTOLOGY ANCILLARY ONLY
CHLAMYDIA, DNA PROBE: NEGATIVE
Neisseria Gonorrhea: NEGATIVE

## 2013-04-26 LAB — MICROALBUMIN / CREATININE URINE RATIO
Creatinine, Urine: 115 mg/dL
Microalb Creat Ratio: 12.5 mg/g (ref 0.0–30.0)
Microalb, Ur: 1.44 mg/dL (ref 0.00–1.89)

## 2013-04-26 LAB — HEPATITIS C ANTIBODY: HCV Ab: NEGATIVE

## 2013-04-26 LAB — RPR

## 2013-05-10 ENCOUNTER — Ambulatory Visit (INDEPENDENT_AMBULATORY_CARE_PROVIDER_SITE_OTHER): Payer: Commercial Managed Care - HMO | Admitting: Infectious Disease

## 2013-05-10 ENCOUNTER — Encounter: Payer: Self-pay | Admitting: Infectious Disease

## 2013-05-10 VITALS — BP 170/104 | HR 85 | Temp 98.3°F | Wt 141.0 lb

## 2013-05-10 DIAGNOSIS — M549 Dorsalgia, unspecified: Secondary | ICD-10-CM

## 2013-05-10 DIAGNOSIS — R109 Unspecified abdominal pain: Secondary | ICD-10-CM

## 2013-05-10 DIAGNOSIS — R1030 Lower abdominal pain, unspecified: Secondary | ICD-10-CM

## 2013-05-10 DIAGNOSIS — B20 Human immunodeficiency virus [HIV] disease: Secondary | ICD-10-CM

## 2013-05-10 DIAGNOSIS — I10 Essential (primary) hypertension: Secondary | ICD-10-CM

## 2013-05-10 DIAGNOSIS — G43909 Migraine, unspecified, not intractable, without status migrainosus: Secondary | ICD-10-CM

## 2013-05-10 MED ORDER — CYCLOBENZAPRINE HCL 10 MG PO TABS: 10.0000 mg | ORAL_TABLET | Freq: Three times a day (TID) | ORAL | Status: DC | PRN

## 2013-05-10 MED ORDER — DRONABINOL 5 MG PO CAPS: 5.0000 mg | ORAL_CAPSULE | Freq: Two times a day (BID) | ORAL | Status: DC

## 2013-05-10 MED ORDER — DARUNAVIR-COBICISTAT 800-150 MG PO TABS: 1.0000 | ORAL_TABLET | Freq: Every day | ORAL | Status: DC

## 2013-05-10 MED ORDER — HYDROCODONE-ACETAMINOPHEN 2.5-325 MG PO TABS
1.0000 | ORAL_TABLET | Freq: Three times a day (TID) | ORAL | Status: DC | PRN
Start: 2013-05-10 — End: 2013-05-10

## 2013-05-10 NOTE — Progress Notes (Signed)
Subjective:    Patient ID: [name], male    DOB: 01-05-1986, [age] y.o.   MRN: [id number]  Back Pain This is a new problem. The current episode started 1 to 4 weeks ago. The problem occurs 2 to 4 times per day. The problem has been gradually worsening since onset. The pain is present in the gluteal and lumbar spine. The quality of the pain is described as aching, burning, cramping, shooting and stabbing. The pain radiates to the right thigh. The pain is at a severity of 7/10. The pain is moderate. The pain is worse during the day. The symptoms are aggravated by bending and position. Stiffness is present in the morning. Pertinent negatives include no abdominal pain, chest pain, dysuria, fever or weakness.    28 year old [place] who had been enrolled in the AIDS clinical trials group trial #5257 where he was receiving Prezista Norvir and Truvada. While in the trial he had perfect virological suppression and healthy CD4 count and is currently nicely suppressed.   Today he comes in with c/o severe LBP, groin pain. He has tried muscle relaxant without relief and specifically requested narcotic at suggestion of his mother who is also a pt here.    .Review of Systems  Constitutional: Negative for fever, chills, diaphoresis, activity change, appetite change, fatigue and unexpected weight change.  HENT: Negative for congestion, rhinorrhea, sinus pressure, sneezing, sore throat and trouble swallowing.   Eyes: Negative for photophobia and visual disturbance.  Respiratory: Negative for cough, chest tightness, shortness of breath, wheezing and stridor.   Cardiovascular: Negative for chest pain, palpitations and leg swelling.  Gastrointestinal: Negative for nausea, vomiting, abdominal pain, diarrhea, constipation, blood in stool, abdominal distention and anal bleeding.  Genitourinary: Negative for dysuria, hematuria, flank pain and difficulty urinating.  Musculoskeletal: Positive for back  pain. Negative for arthralgias, gait problem, joint swelling and myalgias.  Skin: Negative for color change, pallor, rash and wound.  Neurological: Negative for dizziness, tremors, weakness and light-headedness.  Hematological: Negative for adenopathy. Does not bruise/bleed easily.  Psychiatric/Behavioral: Negative for behavioral problems, confusion, sleep disturbance, dysphoric mood, decreased concentration and agitation.       Objective:   Physical Exam  Nursing note and vitals reviewed. Constitutional: He is oriented to person, place, and time. He appears well-developed and well-nourished. No distress.  HENT:  Head: Normocephalic and atraumatic.  Mouth/Throat: Oropharynx is clear and moist. No oropharyngeal exudate.  Eyes: Conjunctivae and EOM are normal. Pupils are equal, round, and reactive to light. No scleral icterus.  Neck: Normal range of motion. Neck supple. No JVD present.  Cardiovascular: Normal rate, regular rhythm and normal heart sounds.  Exam reveals no gallop and no friction rub.   No murmur heard. Pulmonary/Chest: Effort normal and breath sounds normal. No respiratory distress. He has no wheezes. He has no rales. He exhibits no tenderness.  Abdominal: He exhibits no distension and no mass. There is no tenderness. There is no rebound and no guarding.  Musculoskeletal: He exhibits no edema.       Right hip: He exhibits decreased range of motion and tenderness. He exhibits no bony tenderness and no swelling.  Pain with straight leg raise, pain in groin and back with external rotation  Lymphadenopathy:    He has no cervical adenopathy.  Neurological: He is alert and oriented to person, place, and time. He exhibits normal muscle tone. Coordination normal.  Skin: Skin is warm and dry. He is not diaphoretic. No erythema. No pallor.  Psychiatric: He has a normal mood and affect. His behavior is normal. Judgment and thought content normal.          Assessment & Plan    HIV: HIV   HIV: change to prezcobix and truvada  Back pain and groin pain: check plain films. Will give short course of muscle relaxer and low dose narcotic on condition that this is aggressively worked up by [facility] and myself including imaging and that it not continue should there NOT be any clear cut organic pathology be identified. I spent greater than 40 minutes with the patient including greater than 50% of time in face to face counsel of the patient and in coordination of their care.   Migraine headaches continue calcium channel blocker and prescribed Imitrex.  HTN:  Out of control due to pain?

## 2013-05-10 NOTE — Patient Instructions (Signed)
Fu appt in one month without labs

## 2013-05-11 ENCOUNTER — Ambulatory Visit: Payer: Medicare HMO | Admitting: Infectious Disease

## 2013-05-15 ENCOUNTER — Encounter (HOSPITAL_COMMUNITY): Payer: Self-pay | Admitting: Emergency Medicine

## 2013-05-15 ENCOUNTER — Emergency Department (HOSPITAL_COMMUNITY)
Admission: EM | Admit: 2013-05-15 | Discharge: 2013-05-15 | Disposition: A | Discharge status: Home / Self Care | Payer: Medicare HMO | Attending: Emergency Medicine | Admitting: Emergency Medicine

## 2013-05-15 ENCOUNTER — Emergency Department (HOSPITAL_COMMUNITY): Payer: Medicare HMO

## 2013-05-15 DIAGNOSIS — S8000XA Contusion of unspecified knee, initial encounter: Secondary | ICD-10-CM | POA: Insufficient documentation

## 2013-05-15 DIAGNOSIS — J45909 Unspecified asthma, uncomplicated: Secondary | ICD-10-CM | POA: Insufficient documentation

## 2013-05-15 DIAGNOSIS — S0990XA Unspecified injury of head, initial encounter: Secondary | ICD-10-CM | POA: Diagnosis present

## 2013-05-15 DIAGNOSIS — S91009A Unspecified open wound, unspecified ankle, initial encounter: Secondary | ICD-10-CM

## 2013-05-15 DIAGNOSIS — S0510XA Contusion of eyeball and orbital tissues, unspecified eye, initial encounter: Secondary | ICD-10-CM | POA: Diagnosis not present

## 2013-05-15 DIAGNOSIS — S06339A Contusion and laceration of cerebrum, unspecified, with loss of consciousness of unspecified duration, initial encounter: Secondary | ICD-10-CM | POA: Diagnosis not present

## 2013-05-15 DIAGNOSIS — F17200 Nicotine dependence, unspecified, uncomplicated: Secondary | ICD-10-CM | POA: Insufficient documentation

## 2013-05-15 DIAGNOSIS — Z21 Asymptomatic human immunodeficiency virus [HIV] infection status: Secondary | ICD-10-CM | POA: Diagnosis not present

## 2013-05-15 DIAGNOSIS — S069XAA Unspecified intracranial injury with loss of consciousness status unknown, initial encounter: Secondary | ICD-10-CM

## 2013-05-15 DIAGNOSIS — I10 Essential (primary) hypertension: Secondary | ICD-10-CM | POA: Diagnosis not present

## 2013-05-15 DIAGNOSIS — S0993XA Unspecified injury of face, initial encounter: Secondary | ICD-10-CM | POA: Insufficient documentation

## 2013-05-15 DIAGNOSIS — T148XXA Other injury of unspecified body region, initial encounter: Secondary | ICD-10-CM

## 2013-05-15 DIAGNOSIS — S069X9A Unspecified intracranial injury with loss of consciousness of unspecified duration, initial encounter: Secondary | ICD-10-CM

## 2013-05-15 DIAGNOSIS — S81809A Unspecified open wound, unspecified lower leg, initial encounter: Secondary | ICD-10-CM

## 2013-05-15 DIAGNOSIS — S199XXA Unspecified injury of neck, initial encounter: Secondary | ICD-10-CM

## 2013-05-15 DIAGNOSIS — Z79899 Other long term (current) drug therapy: Secondary | ICD-10-CM | POA: Diagnosis not present

## 2013-05-15 DIAGNOSIS — S81009A Unspecified open wound, unspecified knee, initial encounter: Secondary | ICD-10-CM | POA: Insufficient documentation

## 2013-05-15 DIAGNOSIS — S0093XA Contusion of unspecified part of head, initial encounter: Secondary | ICD-10-CM | POA: Diagnosis present

## 2013-05-15 DIAGNOSIS — S8001XA Contusion of right knee, initial encounter: Secondary | ICD-10-CM

## 2013-05-15 MED ORDER — IBUPROFEN 800 MG PO TABS: 800.0000 mg | ORAL_TABLET | Freq: Three times a day (TID) | ORAL | Status: DC

## 2013-05-15 MED ORDER — OXYCODONE-ACETAMINOPHEN 5-325 MG PO TABS
2.0000 | ORAL_TABLET | Freq: Once | ORAL | Status: AC
  Administered 2013-05-15: 2 via ORAL
  Filled 2013-05-15: qty 2

## 2013-05-15 MED ORDER — HYDROCODONE-ACETAMINOPHEN 5-325 MG PO TABS: 1.0000 | ORAL_TABLET | ORAL | Status: DC | PRN

## 2013-05-15 NOTE — Discharge Instructions (Signed)
Call for a follow up appointment with a Family or Primary Care Provider.  Return if Symptoms worsen.   Take medication as prescribed.  Ice your face, knee, neck 3-4 times a day.

## 2013-05-15 NOTE — ED Notes (Signed)
Patient transported to X-ray 

## 2013-05-15 NOTE — ED Notes (Signed)
PA at bedside.
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
## 2013-05-17 ENCOUNTER — Ambulatory Visit: Payer: Medicare HMO

## 2013-05-20 ENCOUNTER — Telehealth: Payer: Self-pay | Admitting: *Deleted

## 2013-05-20 NOTE — Telephone Encounter (Signed)
05/10/13 - Hydrocodone 2.5/325 mg #90 tablets rx - not in stock at [facility].   Pt went to ED on 05/15/13 and obtained rx for hydrocondone 5/325 mg #15 tablets.  Pt called saying that he is unable to get the 2.5/325 hydrocodone rx filled.  Requesting that MD change the rx.  RN stated that she would call [facility] to find out what is going on with the rx.  [name]  [facility] received their stock of hydrocodone 2.5/325 mg tablets.  Pt informed to go to [facility] to pick up his rx. Pt does not currently have a Medication Contract.

## 2013-05-21 NOTE — ED Provider Notes (Signed)
Medical screening examination/treatment/procedure(s) were performed by non-physician practitioner and as supervising physician I was immediately available for consultation/collaboration.   EKG Interpretation None        Brandt LoosenJulie Urania Pearlman, MD 05/21/13 [phone]

## 2013-05-23 NOTE — Telephone Encounter (Signed)
I am NOT familiar with rx him narcotics.

## 2013-06-01 ENCOUNTER — Telehealth: Payer: Self-pay | Admitting: *Deleted

## 2013-06-01 NOTE — Telephone Encounter (Signed)
Called and ordered [name]'s Viagra today.

## 2013-06-06 ENCOUNTER — Telehealth: Payer: Self-pay | Admitting: *Deleted

## 2013-06-06 NOTE — Telephone Encounter (Signed)
Pt's viagra arrived from patient assistance.  Viagra 50 mg #30  Lot [id number] exp 12/26/17

## 2013-06-27 ENCOUNTER — Other Ambulatory Visit: Payer: Self-pay | Admitting: Infectious Disease

## 2013-06-27 ENCOUNTER — Telehealth: Payer: Self-pay | Admitting: *Deleted

## 2013-06-27 DIAGNOSIS — B20 Human immunodeficiency virus [HIV] disease: Secondary | ICD-10-CM

## 2013-06-27 DIAGNOSIS — Z8669 Personal history of other diseases of the nervous system and sense organs: Secondary | ICD-10-CM

## 2013-06-27 MED ORDER — VERAPAMIL HCL ER 120 MG PO TBCR: 120.0000 mg | EXTENDED_RELEASE_TABLET | Freq: Every day | ORAL | Status: DC

## 2013-06-27 NOTE — Telephone Encounter (Signed)
done

## 2013-07-27 ENCOUNTER — Telehealth: Payer: Self-pay | Admitting: *Deleted

## 2013-07-27 NOTE — Telephone Encounter (Signed)
[name].  It will not ship until 07-02-13.  Should be here within 5-7 days after that.

## 2013-09-24 ENCOUNTER — Emergency Department (HOSPITAL_COMMUNITY): Payer: PRIVATE HEALTH INSURANCE

## 2013-09-24 ENCOUNTER — Encounter (HOSPITAL_COMMUNITY): Payer: Self-pay | Admitting: Emergency Medicine

## 2013-09-24 ENCOUNTER — Emergency Department (HOSPITAL_COMMUNITY)
Admission: EM | Admit: 2013-09-24 | Discharge: 2013-09-24 | Disposition: A | Discharge status: Home / Self Care | Payer: PRIVATE HEALTH INSURANCE | Attending: Emergency Medicine | Admitting: Emergency Medicine

## 2013-09-24 DIAGNOSIS — S4991XA Unspecified injury of right shoulder and upper arm, initial encounter: Secondary | ICD-10-CM

## 2013-09-24 DIAGNOSIS — F17200 Nicotine dependence, unspecified, uncomplicated: Secondary | ICD-10-CM | POA: Insufficient documentation

## 2013-09-24 DIAGNOSIS — Z21 Asymptomatic human immunodeficiency virus [HIV] infection status: Secondary | ICD-10-CM | POA: Diagnosis not present

## 2013-09-24 DIAGNOSIS — S46909A Unspecified injury of unspecified muscle, fascia and tendon at shoulder and upper arm level, unspecified arm, initial encounter: Secondary | ICD-10-CM | POA: Diagnosis present

## 2013-09-24 DIAGNOSIS — IMO0002 Reserved for concepts with insufficient information to code with codable children: Secondary | ICD-10-CM | POA: Diagnosis not present

## 2013-09-24 DIAGNOSIS — I10 Essential (primary) hypertension: Secondary | ICD-10-CM | POA: Insufficient documentation

## 2013-09-24 DIAGNOSIS — S199XXA Unspecified injury of neck, initial encounter: Secondary | ICD-10-CM

## 2013-09-24 DIAGNOSIS — S51811A Laceration without foreign body of right forearm, initial encounter: Secondary | ICD-10-CM

## 2013-09-24 DIAGNOSIS — S4980XA Other specified injuries of shoulder and upper arm, unspecified arm, initial encounter: Secondary | ICD-10-CM | POA: Diagnosis present

## 2013-09-24 DIAGNOSIS — S51809A Unspecified open wound of unspecified forearm, initial encounter: Secondary | ICD-10-CM | POA: Insufficient documentation

## 2013-09-24 DIAGNOSIS — Z79899 Other long term (current) drug therapy: Secondary | ICD-10-CM | POA: Diagnosis not present

## 2013-09-24 DIAGNOSIS — S0993XA Unspecified injury of face, initial encounter: Secondary | ICD-10-CM | POA: Diagnosis not present

## 2013-09-24 DIAGNOSIS — T71163A Asphyxiation due to hanging, assault, initial encounter: Secondary | ICD-10-CM | POA: Insufficient documentation

## 2013-09-24 DIAGNOSIS — J45909 Unspecified asthma, uncomplicated: Secondary | ICD-10-CM | POA: Insufficient documentation

## 2013-09-24 MED ORDER — OXYCODONE-ACETAMINOPHEN 5-325 MG PO TABS: 1.0000 | ORAL_TABLET | Freq: Four times a day (QID) | ORAL | Status: DC | PRN

## 2013-09-24 MED ORDER — IBUPROFEN 200 MG PO TABS
600.0000 mg | ORAL_TABLET | Freq: Once | ORAL | Status: AC
  Administered 2013-09-24: 600 mg via ORAL
  Filled 2013-09-24: qty 3

## 2013-09-24 NOTE — Discharge Instructions (Signed)
Assault, General °Assault includes any behavior, whether intentional or reckless, which results in bodily injury to another person and/or damage to property. Included in this would be any behavior, intentional or reckless, that by its nature would be understood (interpreted) by a reasonable person as intent to harm another person or to damage his/her property. Threats may be oral or written. They may be communicated through regular mail, computer, fax, or phone. These threats may be direct or implied. °FORMS OF ASSAULT INCLUDE: °· Physically assaulting a person. This includes physical threats to inflict physical harm as well as: °¨ Slapping. °¨ Hitting. °¨ Poking. °¨ Kicking. °¨ Punching. °¨ Pushing. °· Arson. °· Sabotage. °· Equipment vandalism. °· Damaging or destroying property. °· Throwing or hitting objects. °· Displaying a weapon or an object that appears to be a weapon in a threatening manner. °¨ Carrying a firearm of any kind. °¨ Using a weapon to harm someone. °· Using greater physical size/strength to intimidate another. °¨ Making intimidating or threatening gestures. °¨ Bullying. °¨ Hazing. °· Intimidating, threatening, hostile, or abusive language directed toward another person. °¨ It communicates the intention to engage in violence against that person. And it leads a reasonable person to expect that violent behavior may occur. °· Stalking another person. °IF IT HAPPENS AGAIN: °· Immediately call for emergency help (911 in [place] °· If someone poses clear and immediate danger to you, seek legal authorities to have a protective or restraining order put in place. °· Less threatening assaults can at least be reported to authorities. °STEPS TO TAKE IF A SEXUAL ASSAULT HAS HAPPENED °· Go to an area of safety. This may include a shelter or staying with a friend. Stay away from the area where you have been attacked. A large percentage of sexual assaults are caused by a friend, relative or associate. °· If  medications were given by your caregiver, take them as directed for the full length of time prescribed. °· Only take over-the-counter or prescription medicines for pain, discomfort, or fever as directed by your caregiver. °· If you have come in contact with a sexual disease, find out if you are to be tested again. If your caregiver is concerned about the HIV/AIDS virus, he/she may require you to have continued testing for several months. °· For the protection of your privacy, test results can not be given over the phone. Make sure you receive the results of your test. If your test results are not back during your visit, make an appointment with your caregiver to find out the results. Do not assume everything is normal if you have not heard from your caregiver or the medical facility. It is important for you to follow up on all of your test results. °· File appropriate papers with authorities. This is important in all assaults, even if it has occurred in a family or by a friend. °SEEK MEDICAL CARE IF: °· You have new problems because of your injuries. °· You have problems that may be because of the medicine you are taking, such as: °¨ Rash. °¨ Itching. °¨ Swelling. °¨ Trouble breathing. °· You develop belly (abdominal) pain, feel sick to your stomach (nausea) or are vomiting. °· You begin to run a temperature. °· You need supportive care or referral to a rape crisis center. These are centers with trained personnel who can help you get through this ordeal. °SEEK IMMEDIATE MEDICAL CARE IF: °· You are afraid of being threatened, beaten, or abused. In [place] call 911. °· You   receive new injuries related to abuse.  You develop severe pain in any area injured in the assault or have any change in your condition that concerns you.  You faint or lose consciousness.  You develop chest pain or shortness of breath. Document Released: 01/13/2005 Document Revised: 04/07/2011 Document Reviewed: 09/01/2007 [facility] Patient  Information 2015 [place], [place]. This information is not intended to replace advice given to you by your health care provider. Make sure you discuss any questions you have with your health care provider.  Wound Care Wound care helps prevent pain and infection.  You may need a tetanus shot if:  You cannot remember when you had your last tetanus shot.  You have never had a tetanus shot.  The injury broke your skin. If you need a tetanus shot and you choose not to have one, you may get tetanus. Sickness from tetanus can be serious. HOME CARE   Only take medicine as told by your doctor.  Clean the wound daily with mild soap and water.  Change any bandages (dressings) as told by your doctor.  Put medicated cream and a bandage on the wound as told by your doctor.  Change the bandage if it gets wet, dirty, or starts to smell.  Take showers. Do not take baths, swim, or do anything that puts your wound under water.  Rest and raise (elevate) the wound until the pain and puffiness (swelling) are better.  Keep all doctor visits as told. GET HELP RIGHT AWAY IF:   Yellowish-white fluid (pus) comes from the wound.  Medicine does not lessen your pain.  There is a red streak going away from the wound.  You have a fever. MAKE SURE YOU:   Understand these instructions.  Will watch your condition.  Will get help right away if you are not doing well or get worse. Document Released: 10/23/2007 Document Revised: 04/07/2011 Document Reviewed: 05/19/2010 [facility] Patient Information 2015 [place], [place]. This information is not intended to replace advice given to you by your health care provider. Make sure you discuss any questions you have with your health care provider. Shoulder Sprain A shoulder sprain is the result of damage to the tough, fiber-like tissues (ligaments) that help hold your shoulder in place. The ligaments may be stretched or torn. Besides the main shoulder joint (the ball  and socket), there are several smaller joints that connect the bones in this area. A sprain usually involves one of those joints. Most often it is the acromioclavicular (or AC) joint. That is the joint that connects the collarbone (clavicle) and the shoulder blade (scapula) at the top point of the shoulder blade (acromion). A shoulder sprain is a mild form of what is called a shoulder separation. Recovering from a shoulder sprain may take some time. For some, pain lingers for several months. Most people recover without long term problems. CAUSES   A shoulder sprain is usually caused by some kind of trauma. This might be:  Falling on an outstretched arm.  Being hit hard on the shoulder.  Twisting the arm.  Shoulder sprains are more likely to occur in people who:  Play sports.  Have balance or coordination problems. SYMPTOMS   Pain when you move your shoulder.  Limited ability to move the shoulder.  Swelling and tenderness on top of the shoulder.  Redness or warmth in the shoulder.  Bruising.  A change in the shape of the shoulder. DIAGNOSIS  Your healthcare provider may:  Ask about your symptoms.  Ask about recent activity that might have caused those symptoms.  Examine your shoulder. You may be asked to do simple exercises to test movement. The other shoulder will be examined for comparison.  Order some tests that provide a look inside the body. They can show the extent of the injury. The tests could include:  X-rays.  CT (computed tomography) scan.  MRI (magnetic resonance imaging) scan. RISKS AND COMPLICATIONS  Loss of full shoulder motion.  Ongoing shoulder pain. TREATMENT  How long it takes to recover from a shoulder sprain depends on how severe it was. Treatment options may include:  Rest. You should not use the arm or shoulder until it heals.  Ice. For 2 or 3 days after the injury, put an ice pack on the shoulder up to 4 times a day. It should stay on for  15 to 20 minutes each time. Wrap the ice in a towel so it does not touch your skin.  Over-the-counter medicine to relieve pain.  A sling or brace. This will keep the arm still while the shoulder is healing.  Physical therapy or rehabilitation exercises. These will help you regain strength and motion. Ask your healthcare provider when it is OK to begin these exercises.  Surgery. The need for surgery is rare with a sprained shoulder, but some people may need surgery to keep the joint in place and reduce pain. HOME CARE INSTRUCTIONS   Ask your healthcare provider about what you should and should not do while your shoulder heals.  Make sure you know how to apply ice to the correct area of your shoulder.  Talk with your healthcare provider about which medications should be used for pain and swelling.  If rehabilitation therapy will be needed, ask your healthcare provider to refer you to a therapist. If it is not recommended, then ask about at-home exercises. Find out when exercise should begin. SEEK MEDICAL CARE IF:  Your pain, swelling, or redness at the joint increases. SEEK IMMEDIATE MEDICAL CARE IF:   You have a fever.  You cannot move your arm or shoulder. Document Released: 06/01/2008 Document Revised: 04/07/2011 Document Reviewed: 06/01/2008 [facility] Patient Information 2015 [place], [place]. This information is not intended to replace advice given to you by your health care provider. Make sure you discuss any questions you have with your health care provider.
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
## 2013-09-26 ENCOUNTER — Telehealth: Payer: Self-pay | Admitting: *Deleted

## 2013-09-26 NOTE — Telephone Encounter (Signed)
Re-ordered [name]'s Viagra today.  It will ship on 10-02-13.  Will call and tell Christiane Ha.

## 2013-09-28 ENCOUNTER — Ambulatory Visit (HOSPITAL_COMMUNITY)
Admission: RE | Admit: 2013-09-28 | Discharge: 2013-09-28 | Disposition: A | Discharge status: Home / Self Care | Payer: PRIVATE HEALTH INSURANCE | Source: Ambulatory Visit | Attending: Infectious Disease | Admitting: Infectious Disease

## 2013-09-28 ENCOUNTER — Encounter: Payer: Self-pay | Admitting: Infectious Disease

## 2013-09-28 ENCOUNTER — Other Ambulatory Visit (HOSPITAL_COMMUNITY)
Admission: RE | Admit: 2013-09-28 | Discharge: 2013-09-28 | Disposition: A | Discharge status: Home / Self Care | Payer: PRIVATE HEALTH INSURANCE | Source: Ambulatory Visit | Attending: Infectious Disease | Admitting: Infectious Disease

## 2013-09-28 ENCOUNTER — Ambulatory Visit (INDEPENDENT_AMBULATORY_CARE_PROVIDER_SITE_OTHER): Payer: PRIVATE HEALTH INSURANCE | Admitting: Infectious Disease

## 2013-09-28 VITALS — BP 139/95 | HR 63 | Temp 98.6°F | Wt 131.0 lb

## 2013-09-28 DIAGNOSIS — B20 Human immunodeficiency virus [HIV] disease: Secondary | ICD-10-CM

## 2013-09-28 DIAGNOSIS — Z113 Encounter for screening for infections with a predominantly sexual mode of transmission: Secondary | ICD-10-CM | POA: Diagnosis present

## 2013-09-28 DIAGNOSIS — I10 Essential (primary) hypertension: Secondary | ICD-10-CM

## 2013-09-28 DIAGNOSIS — F17200 Nicotine dependence, unspecified, uncomplicated: Secondary | ICD-10-CM | POA: Insufficient documentation

## 2013-09-28 DIAGNOSIS — I498 Other specified cardiac arrhythmias: Secondary | ICD-10-CM | POA: Diagnosis not present

## 2013-09-28 DIAGNOSIS — R0789 Other chest pain: Secondary | ICD-10-CM | POA: Diagnosis not present

## 2013-09-28 DIAGNOSIS — M2540 Effusion, unspecified joint: Secondary | ICD-10-CM | POA: Insufficient documentation

## 2013-09-28 DIAGNOSIS — R55 Syncope and collapse: Secondary | ICD-10-CM

## 2013-09-28 DIAGNOSIS — IMO0001 Reserved for inherently not codable concepts without codable children: Secondary | ICD-10-CM | POA: Insufficient documentation

## 2013-09-28 DIAGNOSIS — Z006 Encounter for examination for normal comparison and control in clinical research program: Secondary | ICD-10-CM | POA: Diagnosis not present

## 2013-09-28 DIAGNOSIS — M79609 Pain in unspecified limb: Secondary | ICD-10-CM | POA: Insufficient documentation

## 2013-09-28 DIAGNOSIS — T7491XA Unspecified adult maltreatment, confirmed, initial encounter: Secondary | ICD-10-CM

## 2013-09-28 DIAGNOSIS — G43909 Migraine, unspecified, not intractable, without status migrainosus: Secondary | ICD-10-CM | POA: Insufficient documentation

## 2013-09-28 DIAGNOSIS — IMO0002 Reserved for concepts with insufficient information to code with codable children: Secondary | ICD-10-CM

## 2013-09-28 DIAGNOSIS — Z23 Encounter for immunization: Secondary | ICD-10-CM

## 2013-09-28 LAB — COMPLETE METABOLIC PANEL WITH GFR
ALT: 18 U/L (ref 0–53)
AST: 18 U/L (ref 0–37)
Albumin: 4 g/dL (ref 3.5–5.2)
Alkaline Phosphatase: 77 U/L (ref 39–117)
BILIRUBIN TOTAL: 0.3 mg/dL (ref 0.2–1.2)
BUN: 8 mg/dL (ref 6–23)
CALCIUM: 8.8 mg/dL (ref 8.4–10.5)
CHLORIDE: 105 meq/L (ref 96–112)
CO2: 25 mEq/L (ref 19–32)
Creat: 0.87 mg/dL (ref 0.50–1.35)
GFR, Est African American: >89 mL/min
GFR, Est Non African American: >89 mL/min
Glucose, Bld: 73 mg/dL (ref 70–99)
Potassium: 4.1 mEq/L (ref 3.5–5.3)
Sodium: 140 mEq/L (ref 135–145)
Total Protein: 6.8 g/dL (ref 6.0–8.3)

## 2013-09-28 LAB — CBC WITH DIFFERENTIAL/PLATELET
Basophils Absolute: 0.1 10*3/uL (ref 0.0–0.1)
Basophils Relative: 1 % (ref 0–1)
EOS ABS: 0.2 10*3/uL (ref 0.0–0.7)
Eosinophils Relative: 4 % (ref 0–5)
HCT: 40.3 % (ref 39.0–52.0)
HEMOGLOBIN: 13.2 g/dL (ref 13.0–17.0)
LYMPHS ABS: 2.8 10*3/uL (ref 0.7–4.0)
LYMPHS PCT: 54 % — AB (ref 12–46)
MCH: 28.6 pg (ref 26.0–34.0)
MCHC: 32.8 g/dL (ref 30.0–36.0)
MCV: 87.2 fL (ref 78.0–100.0)
MONO ABS: 0.4 10*3/uL (ref 0.1–1.0)
Monocytes Relative: 7 % (ref 3–12)
Neutro Abs: 1.7 10*3/uL (ref 1.7–7.7)
Neutrophils Relative %: 34 % — ABNORMAL LOW (ref 43–77)
Platelets: 220 10*3/uL (ref 150–400)
RBC: 4.62 MIL/uL (ref 4.22–5.81)
RDW: 13.9 % (ref 11.5–15.5)
WBC: 5.1 10*3/uL (ref 4.0–10.5)

## 2013-09-28 LAB — TROPONIN I

## 2013-09-28 LAB — HEPATITIS C ANTIBODY: HCV AB: NEGATIVE

## 2013-09-28 LAB — RPR

## 2013-09-28 MED ORDER — LISINOPRIL 20 MG PO TABS: 20.0000 mg | ORAL_TABLET | Freq: Every day | ORAL | Status: DC

## 2013-09-28 MED ORDER — TRAMADOL HCL 50 MG PO TABS: 50.0000 mg | ORAL_TABLET | Freq: Three times a day (TID) | ORAL | Status: DC | PRN

## 2013-09-28 MED ORDER — BECLOMETHASONE DIPROPIONATE 80 MCG/ACT IN AERS: 1.0000 | INHALATION_SPRAY | Freq: Two times a day (BID) | RESPIRATORY_TRACT | Status: DC
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
## 2013-09-28 NOTE — Patient Instructions (Signed)
Stop the DILTIAZEM  START LISINOPRIL 2O MG DAILY  COME BACK FOR RN VISIT FOR BP CHECK AND RECHECK OF BLOOD WORK  YOU NEED TO SEE CARDIOLOGIST AND HAVE AN ECHOCARDIOGRAM

## 2013-09-29 ENCOUNTER — Ambulatory Visit: Payer: Medicare HMO | Admitting: Infectious Disease

## 2013-09-29 LAB — T-HELPER CELL (CD4) - (RCID CLINIC ONLY)
CD4 T CELL HELPER: 29 % — AB (ref 33–55)
CD4 T Cell Abs: 770 /uL (ref 400–2700)

## 2013-09-29 LAB — CORTISOL: Cortisol, Plasma: 12.6 ug/dL

## 2013-09-30 LAB — HIV-1 RNA QUANT-NO REFLEX-BLD

## 2013-10-02 LAB — HLA B*5701: HLA-B*5701 w/rflx HLA-B High: NEGATIVE

## 2013-10-05 ENCOUNTER — Other Ambulatory Visit (HOSPITAL_COMMUNITY): Payer: PRIVATE HEALTH INSURANCE

## 2013-10-06 ENCOUNTER — Encounter (HOSPITAL_COMMUNITY): Payer: Self-pay | Admitting: Infectious Disease

## 2013-10-12 ENCOUNTER — Other Ambulatory Visit (HOSPITAL_COMMUNITY): Payer: PRIVATE HEALTH INSURANCE

## 2013-10-13 ENCOUNTER — Encounter (HOSPITAL_COMMUNITY): Payer: Self-pay | Admitting: Infectious Disease

## 2013-10-17 ENCOUNTER — Telehealth: Payer: Self-pay | Admitting: Infectious Disease

## 2013-10-17 NOTE — Telephone Encounter (Signed)
Patient was schedule for ECHOCARDIOGRAM on 9-9 and 9-16 and have no showed for the appointments.   He has an appointment to see Dr. Tenny Craw on 10-27-13.    Notice has been sent to patient regarding No Show Appointment.  Will defer order for echocardiogram x 1 month.

## 2013-10-18 NOTE — Telephone Encounter (Signed)
I am very sorry about that

## 2013-10-19 ENCOUNTER — Encounter (HOSPITAL_COMMUNITY): Payer: Self-pay | Admitting: Emergency Medicine

## 2013-10-19 ENCOUNTER — Emergency Department (HOSPITAL_COMMUNITY)
Admission: EM | Admit: 2013-10-19 | Discharge: 2013-10-19 | Discharge status: Against Medical Advice | Payer: PRIVATE HEALTH INSURANCE | Attending: Emergency Medicine | Admitting: Emergency Medicine

## 2013-10-19 DIAGNOSIS — G43909 Migraine, unspecified, not intractable, without status migrainosus: Secondary | ICD-10-CM | POA: Diagnosis not present

## 2013-10-19 DIAGNOSIS — J45909 Unspecified asthma, uncomplicated: Secondary | ICD-10-CM | POA: Diagnosis not present

## 2013-10-19 DIAGNOSIS — I10 Essential (primary) hypertension: Secondary | ICD-10-CM | POA: Insufficient documentation

## 2013-10-19 DIAGNOSIS — Z21 Asymptomatic human immunodeficiency virus [HIV] infection status: Secondary | ICD-10-CM | POA: Diagnosis not present

## 2013-10-19 DIAGNOSIS — F17200 Nicotine dependence, unspecified, uncomplicated: Secondary | ICD-10-CM | POA: Insufficient documentation

## 2013-10-19 NOTE — ED Notes (Signed)
Pt reports approx 1 mo hx of ongoing right sided headache. Pt states he's had migraines in the past and this feels similar. Pt states has tried, tylenol, advil, tramadol with little relief of symptoms. Pt awake, alert, oriented x4, NAD at present.

## 2013-10-24 ENCOUNTER — Ambulatory Visit (HOSPITAL_COMMUNITY): Payer: PRIVATE HEALTH INSURANCE | Attending: Infectious Disease | Admitting: Radiology

## 2013-10-24 DIAGNOSIS — Z23 Encounter for immunization: Secondary | ICD-10-CM

## 2013-10-24 DIAGNOSIS — R55 Syncope and collapse: Secondary | ICD-10-CM | POA: Diagnosis not present

## 2013-10-24 DIAGNOSIS — R072 Precordial pain: Secondary | ICD-10-CM | POA: Insufficient documentation

## 2013-10-24 DIAGNOSIS — Z87891 Personal history of nicotine dependence: Secondary | ICD-10-CM | POA: Diagnosis not present

## 2013-10-24 DIAGNOSIS — R011 Cardiac murmur, unspecified: Secondary | ICD-10-CM | POA: Diagnosis not present

## 2013-10-24 DIAGNOSIS — IMO0002 Reserved for concepts with insufficient information to code with codable children: Secondary | ICD-10-CM

## 2013-10-24 DIAGNOSIS — B20 Human immunodeficiency virus [HIV] disease: Secondary | ICD-10-CM

## 2013-10-24 DIAGNOSIS — R0789 Other chest pain: Secondary | ICD-10-CM

## 2013-10-24 NOTE — Progress Notes (Signed)
Echocardiogram performed.  

## 2013-10-27 ENCOUNTER — Institutional Professional Consult (permissible substitution): Payer: PRIVATE HEALTH INSURANCE | Admitting: Internal Medicine

## 2013-11-26 ENCOUNTER — Encounter (HOSPITAL_COMMUNITY): Payer: Self-pay | Admitting: Emergency Medicine

## 2013-11-26 ENCOUNTER — Emergency Department (HOSPITAL_COMMUNITY)
Admission: EM | Admit: 2013-11-26 | Discharge: 2013-11-26 | Disposition: A | Discharge status: Home / Self Care | Payer: PRIVATE HEALTH INSURANCE | Attending: Emergency Medicine | Admitting: Emergency Medicine

## 2013-11-26 DIAGNOSIS — R21 Rash and other nonspecific skin eruption: Secondary | ICD-10-CM | POA: Diagnosis present

## 2013-11-26 DIAGNOSIS — I10 Essential (primary) hypertension: Secondary | ICD-10-CM | POA: Insufficient documentation

## 2013-11-26 DIAGNOSIS — Z720 Tobacco use: Secondary | ICD-10-CM | POA: Insufficient documentation

## 2013-11-26 DIAGNOSIS — Z79899 Other long term (current) drug therapy: Secondary | ICD-10-CM | POA: Insufficient documentation

## 2013-11-26 DIAGNOSIS — J45909 Unspecified asthma, uncomplicated: Secondary | ICD-10-CM | POA: Insufficient documentation

## 2013-11-26 DIAGNOSIS — Z21 Asymptomatic human immunodeficiency virus [HIV] infection status: Secondary | ICD-10-CM | POA: Insufficient documentation

## 2013-11-26 MED ORDER — HYDROCORTISONE 1 % EX CREA: TOPICAL_CREAM | CUTANEOUS | Status: DC

## 2013-11-26 MED ORDER — DIPHENHYDRAMINE HCL 25 MG PO TABS: 25.0000 mg | ORAL_TABLET | Freq: Four times a day (QID) | ORAL | Status: DC | PRN

## 2013-11-26 MED ORDER — DIPHENHYDRAMINE HCL 25 MG PO CAPS
25.0000 mg | ORAL_CAPSULE | Freq: Once | ORAL | Status: AC
  Administered 2013-11-26: 25 mg via ORAL
  Filled 2013-11-26: qty 1

## 2013-11-26 NOTE — Discharge Instructions (Signed)
Your rash appears to be secondary to insect bites.  Please wash all of your close and bedding in very hot water.  Monitor for signs of insects.  Wash with antibacterial soap.  Use hydrocortisone cream on itchy areas.  Take Benadryl as needed for itching.  Follow-up with your doctor for recheck if rash is not improving.   Rash A rash is a change in the color or texture of your skin. There are many different types of rashes. You may have other problems that accompany your rash. CAUSES   Infections.  Allergic reactions. This can include allergies to pets or foods.  Certain medicines.  Exposure to certain chemicals, soaps, or cosmetics.  Heat.  Exposure to poisonous plants.  Tumors, both cancerous and noncancerous. SYMPTOMS   Redness.  Scaly skin.  Itchy skin.  Dry or cracked skin.  Bumps.  Blisters.  Pain. DIAGNOSIS  Your caregiver may do a physical exam to determine what type of rash you have. A skin sample (biopsy) may be taken and examined under a microscope. TREATMENT  Treatment depends on the type of rash you have. Your caregiver may prescribe certain medicines. For serious conditions, you may need to see a skin doctor (dermatologist). HOME CARE INSTRUCTIONS   Avoid the substance that caused your rash.  Do not scratch your rash. This can cause infection.  You may take cool baths to help stop itching.  Only take over-the-counter or prescription medicines as directed by your caregiver.  Keep all follow-up appointments as directed by your caregiver. SEEK IMMEDIATE MEDICAL CARE IF:  You have increasing pain, swelling, or redness.  You have a fever.  You have new or severe symptoms.  You have body aches, diarrhea, or vomiting.  Your rash is not better after 3 days. MAKE SURE YOU:  Understand these instructions.  Will watch your condition.  Will get help right away if you are not doing well or get worse. Document Released: 01/03/2002 Document Revised:  04/07/2011 Document Reviewed: 10/28/2010 [facility] Patient Information 2015 [place], [place]. This information is not intended to replace advice given to you by your health care provider. Make sure you discuss any questions you have with your health care provider.

## 2013-11-26 NOTE — ED Provider Notes (Signed)
CSN: [id number]     Arrival date & time 11/26/13  0411 History   First MD Initiated Contact with Patient 11/26/13 [phone]     Chief Complaint  Patient presents with  . Rash     (Consider location/radiation/quality/duration/timing/severity/associated sxs/prior Treatment) HPI 28 year old male presents to the emergency department with complaint of 3 days of itchy rash.  Patient reports he has small bumps to his face neck back arms and legs.  He denies seeing any insects.  He reports that a friend had an outbreak of shingles, but he did not come in contact with that rash.  He denies any fever or chills.  No known insect bites, no reported bedbugs or scabies in the home.  No one else in the home has similar rash Past Medical History  Diagnosis Date  . HIV disease     CD4 488, viral load undetectable on 12/2010  . Bronchitis   . Asthma   . Hypertension    Past Surgical History  Procedure Laterality Date  . Hand surgery     Family History  Problem Relation Age of Onset  . Hypertension Mother   . Diabetes Mother    History  Substance Use Topics  . Smoking status: Current Every Day Smoker -- 0.30 packs/day    Types: Cigarettes  . Smokeless tobacco: Never Used  . Alcohol Use: No    Review of Systems  See History of Present Illness; otherwise all other systems are reviewed and negative   Allergies  Review of patient's allergies indicates no known allergies.  Home Medications   Prior to Admission medications   Medication Sig Start Date End Date Taking? Authorizing Provider  albuterol (PROVENTIL HFA;VENTOLIN HFA) 108 (90 BASE) MCG/ACT inhaler Inhale 1-2 puffs into the lungs every 6 (six) hours as needed for wheezing or shortness of breath.    Historical Provider, MD  beclomethasone (QVAR) 80 MCG/ACT inhaler Inhale 1 puff into the lungs 2 (two) times daily. 09/28/13   Randall Hissornelius N Van Dam, MD  darunavir-cobicistat (PREZCOBIX) 800-150 MG per tablet Take 1 tablet by mouth daily. 05/10/13    Randall Hissornelius N Van Dam, MD  diphenhydrAMINE (BENADRYL) 25 MG tablet Take 1 tablet (25 mg total) by mouth every 6 (six) hours as needed for itching. 11/26/13   Olivia Mackielga M Kendal Ghazarian, MD  hydrocortisone cream 1 % Apply to affected area 2 times daily 11/26/13   Olivia Mackielga M Sommer Spickard, MD  lisinopril (PRINIVIL,ZESTRIL) 20 MG tablet Take 1 tablet (20 mg total) by mouth daily. 09/28/13   Randall Hissornelius N Van Dam, MD  oxyCODONE-acetaminophen (PERCOCET/ROXICET) 5-325 MG per tablet Take 1-2 tablets by mouth every 6 (six) hours as needed for severe pain. 09/24/13   Juliet RudeNathan R. Rubin PayorPickering, MD  traMADol (ULTRAM) 50 MG tablet Take 2 tablets (100 mg total) by mouth every 8 (eight) hours as needed for pain (take with ibuprofen as instructed). 08/07/10 08/17/10  Carolin GuernseyBradford A Farrington, NP  traMADol (ULTRAM) 50 MG tablet Take 1 tablet (50 mg total) by mouth 3 (three) times daily as needed. 09/28/13   Randall Hissornelius N Van Dam, MD  TRUVADA 200-300 MG per tablet Take 1 tablet by mouth daily.  08/26/13   Historical Provider, MD  verapamil (CALAN-SR) 120 MG CR tablet Take 120 mg by mouth daily. 08/26/13   Historical Provider, MD   BP 120/69  Pulse 69  Temp(Src) 98.5 F (36.9 C) (Oral)  Resp 18  Ht 5\' 9"  (1.753 m)  Wt 146 lb (66.225 kg)  BMI 21.55 kg/m2  SpO2 99% Physical Exam  Cardiovascular: Normal rate, regular rhythm, normal heart sounds and intact distal pulses.  Exam reveals no gallop and no friction rub.   No murmur heard. Pulmonary/Chest: Effort normal and breath sounds normal. No respiratory distress. He has no wheezes. He has no rales. He exhibits no tenderness.  Skin: Skin is warm and dry. Rash noted. No erythema. No pallor.  Patient has scattered papules most of them excoriated with healing eschar.  He has very few small pustules, mainly on her back and left leg.  Majority of papules have raised red center consistent with a insect bite    ED Course  Procedures (including critical care time) Labs Review Labs Reviewed - No data to  display  Imaging Review No results found.   EKG Interpretation None      MDM   Final diagnoses:  Rash    28 year old male with 3 days of a rash.  It appears that he has insect bites, other differential would be a folliculitis.  Patient instructed to use hypoallergenic antibacterial soap, use hydrocortisone cream and Benadryl.  Follow up with his primary care doctor if not improving.  Olivia Mackielga M Zayne Draheim, MD 11/26/13 [phone]

## 2013-11-26 NOTE — ED Notes (Signed)
Pt c/o a generalized rash for 3 days painful and itches.  No previous history.  No difficulty breathing

## 2013-12-12 ENCOUNTER — Institutional Professional Consult (permissible substitution): Payer: PRIVATE HEALTH INSURANCE | Admitting: Internal Medicine

## 2013-12-21 ENCOUNTER — Encounter (HOSPITAL_COMMUNITY): Payer: Self-pay | Admitting: Emergency Medicine

## 2013-12-21 ENCOUNTER — Emergency Department (HOSPITAL_COMMUNITY)
Admission: EM | Admit: 2013-12-21 | Discharge: 2013-12-21 | Disposition: A | Discharge status: Home / Self Care | Payer: PRIVATE HEALTH INSURANCE | Attending: Emergency Medicine | Admitting: Emergency Medicine

## 2013-12-21 DIAGNOSIS — I10 Essential (primary) hypertension: Secondary | ICD-10-CM | POA: Diagnosis not present

## 2013-12-21 DIAGNOSIS — Z21 Asymptomatic human immunodeficiency virus [HIV] infection status: Secondary | ICD-10-CM | POA: Insufficient documentation

## 2013-12-21 DIAGNOSIS — M79642 Pain in left hand: Secondary | ICD-10-CM

## 2013-12-21 DIAGNOSIS — Z7951 Long term (current) use of inhaled steroids: Secondary | ICD-10-CM | POA: Diagnosis not present

## 2013-12-21 DIAGNOSIS — Z720 Tobacco use: Secondary | ICD-10-CM | POA: Insufficient documentation

## 2013-12-21 DIAGNOSIS — Z7952 Long term (current) use of systemic steroids: Secondary | ICD-10-CM | POA: Diagnosis not present

## 2013-12-21 DIAGNOSIS — M5481 Occipital neuralgia: Secondary | ICD-10-CM | POA: Diagnosis not present

## 2013-12-21 DIAGNOSIS — J45909 Unspecified asthma, uncomplicated: Secondary | ICD-10-CM | POA: Insufficient documentation

## 2013-12-21 DIAGNOSIS — R51 Headache: Secondary | ICD-10-CM | POA: Diagnosis present

## 2013-12-21 DIAGNOSIS — Z79899 Other long term (current) drug therapy: Secondary | ICD-10-CM | POA: Insufficient documentation

## 2013-12-21 LAB — COMPREHENSIVE METABOLIC PANEL
ALT: 18 U/L (ref 0–53)
AST: 31 U/L (ref 0–37)
Albumin: 3.9 g/dL (ref 3.5–5.2)
Alkaline Phosphatase: 70 U/L (ref 39–117)
Anion gap: 10 (ref 5–15)
BUN: 9 mg/dL (ref 6–23)
CO2: 24 meq/L (ref 19–32)
Calcium: 8.6 mg/dL (ref 8.4–10.5)
Chloride: 106 mEq/L (ref 96–112)
Creatinine, Ser: 0.79 mg/dL (ref 0.50–1.35)
GFR calc Af Amer: >90 mL/min (ref >90)
Glucose, Bld: 87 mg/dL (ref 70–99)
Potassium: 5.2 mEq/L (ref 3.7–5.3)
SODIUM: 140 meq/L (ref 137–147)
Total Bilirubin: <0.2 mg/dL — ABNORMAL LOW (ref 0.3–1.2)
Total Protein: 7.3 g/dL (ref 6.0–8.3)

## 2013-12-21 LAB — URINALYSIS, ROUTINE W REFLEX MICROSCOPIC
Bilirubin Urine: NEGATIVE
GLUCOSE, UA: NEGATIVE mg/dL
HGB URINE DIPSTICK: NEGATIVE
Ketones, ur: NEGATIVE mg/dL
Leukocytes, UA: NEGATIVE
Nitrite: NEGATIVE
Protein, ur: NEGATIVE mg/dL
SPECIFIC GRAVITY, URINE: 1.018 (ref 1.005–1.030)
Urobilinogen, UA: 0.2 mg/dL (ref 0.0–1.0)
pH: 6 (ref 5.0–8.0)

## 2013-12-21 LAB — CBC WITH DIFFERENTIAL/PLATELET
BASOS PCT: 0 % (ref 0–1)
Basophils Absolute: 0 10*3/uL (ref 0.0–0.1)
EOS PCT: 5 % (ref 0–5)
Eosinophils Absolute: 0.3 10*3/uL (ref 0.0–0.7)
HEMATOCRIT: 37.4 % — AB (ref 39.0–52.0)
Hemoglobin: 12.3 g/dL — ABNORMAL LOW (ref 13.0–17.0)
LYMPHS PCT: 66 % — AB (ref 12–46)
Lymphs Abs: 4.2 10*3/uL — ABNORMAL HIGH (ref 0.7–4.0)
MCH: 28 pg (ref 26.0–34.0)
MCHC: 32.9 g/dL (ref 30.0–36.0)
MCV: 85.2 fL (ref 78.0–100.0)
MONO ABS: 0.6 10*3/uL (ref 0.1–1.0)
Monocytes Relative: 9 % (ref 3–12)
NEUTROS ABS: 1.2 10*3/uL — AB (ref 1.7–7.7)
Neutrophils Relative %: 20 % — ABNORMAL LOW (ref 43–77)
PLATELETS: 149 10*3/uL — AB (ref 150–400)
RBC: 4.39 MIL/uL (ref 4.22–5.81)
RDW: 14.2 % (ref 11.5–15.5)
WBC: 6.3 10*3/uL (ref 4.0–10.5)

## 2013-12-21 MED ORDER — TRAMADOL HCL 50 MG PO TABS: 100.0000 mg | ORAL_TABLET | Freq: Three times a day (TID) | ORAL | Status: AC | PRN

## 2013-12-21 MED ORDER — BUPIVACAINE HCL (PF) 0.5 % IJ SOLN
10.0000 mL | Freq: Once | INTRAMUSCULAR | Status: AC
  Administered 2013-12-21: 10 mL
  Filled 2013-12-21: qty 10

## 2013-12-21 MED ORDER — BETAMETHASONE SOD PHOS & ACET 6 (3-3) MG/ML IJ SUSP
12.0000 mg | Freq: Once | INTRAMUSCULAR | Status: AC
  Administered 2013-12-21: 12 mg via INTRAMUSCULAR
  Filled 2013-12-21: qty 2

## 2013-12-21 NOTE — ED Notes (Signed)
MEDS discarded in sharps by staff before this RN could scan. Charge RN made aware.

## 2013-12-21 NOTE — Discharge Instructions (Signed)
Occipital Neuralgia Occipital neuralgia is a type of headache that causes episodes of very bad pain in the back of your head. Pain from occipital neuralgia may spread (radiate) to other parts of your head. The pain is usually brief and often goes away after you rest and relax. These headaches may be caused by irritation of the nerves that leave your spinal cord high up in your neck, just below the base of your skull (occipital nerves). Your occipital nerves transmit sensations from the back of your head, the top of your head, and the areas behind your ears. CAUSES Occipital neuralgia can occur without any known cause (primary headache syndrome). In other cases, occipital neuralgia is caused by pressure on or irritation of one of the two occipital nerves. Causes of occipital nerve compression or irritation include:  Wear and tear of the vertebrae in the neck (osteoarthritis).  Neck injury.  Disease of the disks that separate the vertebrae.  Tumors.  Gout.  Infections.  Diabetes.  Swollen blood vessels that put pressure on the occipital nerves.  Muscle spasm in the neck. SIGNS AND SYMPTOMS Pain is the main symptom of occipital neuralgia. It usually starts in the back of the head but may also be felt in other areas supplied by the occipital nerves. Pain is usually on one side but may be on both sides. You may have:   Brief episodes of very bad pain that is burning, stabbing, shocking, or shooting.  Pain behind the eye.  Pain triggered by neck movement or hair brushing.  Scalp tenderness.  Aching in the back of the head between episodes of very bad pain. DIAGNOSIS  Your health care provider may diagnose occipital neuralgia based on your symptoms and a physical exam. During the exam, the health care provider may push on areas supplied by the occipital nerves to see if they are painful. Some tests may also be done to help in making the diagnosis. These may include:  Imaging studies of  the upper spinal cord, such as an MRI or CT scan. These may show compression or spinal cord abnormalities.  Nerve block. You will get an injection of numbing medicine (local anesthetic) near the occipital nerve to see if this relieves pain. TREATMENT  Treatment may begin with simple measures, such as:   Rest.  Massage.  Heat.  Over-the-counter pain relievers. If these measures do not work, you may need other treatments, including:  Medicines such as:  Prescription-strength anti-inflammatory medicines.  Muscle relaxants.  Antiseizure medicines.  Antidepressants.  Steroid injection. This involves injections of local anesthetic and strong anti-inflammatory drugs (steroids).  Pulsed radiofrequency. Wires are implanted to deliver electrical impulses that block pain signals from the occipital nerve.  Physical therapy.  Surgery to relieve nerve pressure. HOME CARE INSTRUCTIONS  Take all medicines as directed by your health care provider.  Avoid activities that cause pain.  Rest when you have an attack of pain.  Try gentle massage or a heating pad to relieve pain.  Work with a physical therapist to learn stretching exercises you can do at home.  Try a different pillow or sleeping position.  Practice good posture.  Try to stay active. Get regular exercise that does not cause pain. Ask your health care provider to suggest safe exercises for you.  Keep all follow-up visits as directed by your health care provider. This is important. SEEK MEDICAL CARE IF:  Your medicine is not working.  You have new or worsening symptoms. [place]  IF:  You have very bad head pain that is not going away.  You have a sudden change in vision, balance, or speech. MAKE SURE YOU:  Understand these instructions.  Will watch your condition.  Will get help right away if you are not doing well or get worse. Document Released: 01/07/2001 Document Revised: 05/30/2013  Document Reviewed: 01/05/2013 [facility] Patient Information 2015 [place], [place]. This information is not intended to replace advice given to you by your health care provider. Make sure you discuss any questions you have with your health care provider. Hypertension Hypertension, commonly called high blood pressure, is when the force of blood pumping through your arteries is too strong. Your arteries are the blood vessels that carry blood from your heart throughout your body. A blood pressure reading consists of a higher number over a lower number, such as 110/72. The higher number (systolic) is the pressure inside your arteries when your heart pumps. The lower number (diastolic) is the pressure inside your arteries when your heart relaxes. Ideally you want your blood pressure below 120/80. Hypertension forces your heart to work harder to pump blood. Your arteries may become narrow or stiff. Having hypertension puts you at risk for heart disease, stroke, and other problems.  RISK FACTORS Some risk factors for high blood pressure are controllable. Others are not.  Risk factors you cannot control include:  Race. You may be at higher risk if you are African American. Age. Risk increases with age. Gender. Men are at higher risk than women before age 29 years. After age 29, women are at higher risk than men. Risk factors you can control include: Not getting enough exercise or physical activity. Being overweight. Getting too much fat, sugar, calories, or salt in your diet. Drinking too much alcohol. SIGNS AND SYMPTOMS Hypertension does not usually cause signs or symptoms. Extremely high blood pressure (hypertensive crisis) may cause headache, anxiety, shortness of breath, and nosebleed. DIAGNOSIS  To check if you have hypertension, your health care provider will measure your blood pressure while you are seated, with your arm held at the level of your heart. It should be measured at least twice using the  same arm. Certain conditions can cause a difference in blood pressure between your right and left arms. A blood pressure reading that is higher than normal on one occasion does not mean that you need treatment. If one blood pressure reading is high, ask your health care provider about having it checked again. TREATMENT  Treating high blood pressure includes making lifestyle changes and possibly taking medicine. Living a healthy lifestyle can help lower high blood pressure. You may need to change some of your habits. Lifestyle changes may include: Following the DASH diet. This diet is high in fruits, vegetables, and whole grains. It is low in salt, red meat, and added sugars. Getting at least 2 hours of brisk physical activity every week. Losing weight if necessary. Not smoking. Limiting alcoholic beverages. Learning ways to reduce stress. If lifestyle changes are not enough to get your blood pressure under control, your health care provider may prescribe medicine. You may need to take more than one. Work closely with your health care provider to understand the risks and benefits. HOME CARE INSTRUCTIONS Have your blood pressure rechecked as directed by your health care provider.  Take medicines only as directed by your health care provider. Follow the directions carefully. Blood pressure medicines must be taken as prescribed. The medicine does not work as well when you skip  doses. Skipping doses also puts you at risk for problems.  Do not smoke.  Monitor your blood pressure at home as directed by your health care provider. SEEK MEDICAL CARE IF:  You think you are having a reaction to medicines taken. You have recurrent headaches or feel dizzy. You have swelling in your ankles. You have trouble with your vision. SEEK IMMEDIATE MEDICAL CARE IF: You develop a severe headache or confusion. You have unusual weakness, numbness, or feel faint. You have severe chest or abdominal pain. You vomit  repeatedly. You have trouble breathing. MAKE SURE YOU:  Understand these instructions. Will watch your condition. Will get help right away if you are not doing well or get worse. Document Released: 01/13/2005 Document Revised: 05/30/2013 Document Reviewed: 11/05/2012 [facility] Patient Information 2015 [place], [place]. This information is not intended to replace advice given to you by your health care provider. Make sure you discuss any questions you have with your health care provider.   Trigger Point Injection Trigger points are areas where you have muscle pain. A trigger point injection is a shot given in the trigger point to relieve that pain. A trigger point might feel like a knot in your muscle. It hurts to press on a trigger point. Sometimes the pain spreads out (radiates) to other parts of the body. For example, pressing on a trigger point in your shoulder might cause pain in your arm or neck. You might have one trigger point. Or, you might have more than one. People often have trigger points in their upper back and lower back. They also occur often in the neck and shoulders. Pain from a trigger point lasts for a long time. It can make it hard to keep moving. You might not be able to do the exercise or physical therapy that could help you deal with the pain. A trigger point injection may help. It does not work for everyone. But, it may relieve your pain for a few days or a few months. A trigger point injection does not cure long-lasting (chronic) pain. LET YOUR CAREGIVER KNOW ABOUT:  Any allergies (especially to latex, lidocaine, or steroids).  Blood-thinning medicines that you take. These drugs can lead to bleeding or bruising after an injection. They include:  Aspirin.  Ibuprofen.  Clopidogrel.  Warfarin.  Other medicines you take. This includes all vitamins, herbs, eyedrops, over-the-counter medicines, and creams.  Use of steroids.  Recent infections.  Past problems with  numbing medicines.  Bleeding problems.  Surgeries you have had.  Other health problems. RISKS AND COMPLICATIONS A trigger point injection is a safe treatment. However, problems may develop, such as:  Minor side effects usually go away in 1 to 2 days. These may include:  Soreness.  Bruising.  Stiffness.  More serious problems are rare. But, they may include:  Bleeding under the skin (hematoma).  Skin infection.  Breaking off of the needle under your skin.  Lung puncture.  The trigger point injection may not work for you. BEFORE THE PROCEDURE You may need to stop taking any medicine that thins your blood. This is to prevent bleeding and bruising. Usually these medicines are stopped several days before the injection. No other preparation is needed. PROCEDURE  A trigger point injection can be given in your caregiver's office or in a clinic. Each injection takes 2 minutes or less.  Your caregiver will feel for trigger points. The caregiver may use a marker to circle the area for the injection.  The skin over  the trigger point will be washed with a germ-killing (antiseptic) solution.  The caregiver pinches the spot for the injection.  Then, a very thin needle is used for the shot. You may feel pain or a twitching feeling when the needle enters the trigger point.  A numbing solution may be injected into the trigger point. Sometimes a drug to keep down swelling, redness, and warmth (inflammation) is also injected.  Your caregiver moves the needle around the trigger zone until the tightness and twitching goes away.  After the injection, your caregiver may put gentle pressure over the injection site.  Then it is covered with a bandage. AFTER THE PROCEDURE  You can go right home after the injection.  The bandage can be taken off after a few hours.  You may feel sore and stiff for 1 to 2 days.  Go back to your regular activities slowly. Your caregiver may ask you to  stretch your muscles. Do not do anything that takes extra energy for a few days.  Follow your caregiver's instructions to manage and treat other pain. Document Released: 01/02/2011 Document Revised: 05/10/2012 Document Reviewed: 01/02/2011 [facility] Patient Information 2015 [place], [place]. This information is not intended to replace advice given to you by your health care provider. Make sure you discuss any questions you have with your health care provider.

## 2013-12-21 NOTE — ED Notes (Signed)
Pt arrives from home with c/o headache for two weeks, right sided. Intermittent shooting pains that causes patient to twitch his head violently. Sensitivity to sound. Denies A/V hallucinations. States he didn't try any medications at home.

## 2013-12-21 NOTE — ED Provider Notes (Signed)
CSN: [id number]     Arrival date & time 12/21/13  0548 History   First MD Initiated Contact with Patient 12/21/13 [phone]     Chief Complaint  Patient presents with  . Headache     (Consider location/radiation/quality/duration/timing/severity/associated sxs/prior Treatment) HPI  This is a 28 year old male with a past medical history of HIV, followed by Dr. Zenaida NieceVan dam his last viral load was undetectable and CD4 count as of May scratch that March 2015 was 950. Patient presents today for chief complaint of right sided headache. He states that he has had this several times before. He describes the pain as being in the scalp, intermittent, severe, sharp, electrical and shooting. It is worse with movement of the neck and palpation of the scalp. He denies history of migraine headaches. He has taken over-the-counter pain medication without relief. He denies any visual disturbances, nausea, vomiting, unilateral weakness, difficulty with speech or swallowing. Patient states that he is taking his HIV medications as prescribed.  Past Medical History  Diagnosis Date  . HIV disease     CD4 488, viral load undetectable on 12/2010  . Bronchitis   . Asthma   . Hypertension    Past Surgical History  Procedure Laterality Date  . Hand surgery     Family History  Problem Relation Age of Onset  . Hypertension Mother   . Diabetes Mother    History  Substance Use Topics  . Smoking status: Current Some Day Smoker -- 0.30 packs/day    Types: Cigarettes  . Smokeless tobacco: Never Used  . Alcohol Use: No    Review of Systems  Ten systems reviewed and are negative for acute change, except as noted in the HPI.    Allergies  Review of patient's allergies indicates no known allergies.  Home Medications   Prior to Admission medications   Medication Sig Start Date End Date Taking? Authorizing Provider  albuterol (PROVENTIL HFA;VENTOLIN HFA) 108 (90 BASE) MCG/ACT inhaler Inhale 1-2 puffs into the lungs  every 6 (six) hours as needed for wheezing or shortness of breath.   Yes Historical Provider, MD  beclomethasone (QVAR) 80 MCG/ACT inhaler Inhale 1 puff into the lungs 2 (two) times daily. 09/28/13  Yes Randall Hissornelius N [place] Dam, MD  darunavir-cobicistat (PREZCOBIX) 800-150 MG per tablet Take 1 tablet by mouth daily. 05/10/13  Yes Randall Hissornelius N Van Dam, MD  lisinopril (PRINIVIL,ZESTRIL) 20 MG tablet Take 1 tablet (20 mg total) by mouth daily. 09/28/13  Yes Randall Hissornelius N Van [place], MD  TRUVADA 200-300 MG per tablet Take 1 tablet by mouth daily.  08/26/13  Yes Historical Provider, MD  verapamil (CALAN-SR) 120 MG CR tablet Take 120 mg by mouth daily. 08/26/13  Yes Historical Provider, MD  diphenhydrAMINE (BENADRYL) 25 MG tablet Take 1 tablet (25 mg total) by mouth every 6 (six) hours as needed for itching. Patient not taking: Reported on 12/21/2013 11/26/13   Olivia Mackielga M Otter, MD  hydrocortisone cream 1 % Apply to affected area 2 times daily Patient not taking: Reported on 12/21/2013 11/26/13   Olivia Mackielga M Otter, MD  oxyCODONE-acetaminophen (PERCOCET/ROXICET) 5-325 MG per tablet Take 1-2 tablets by mouth every 6 (six) hours as needed for severe pain. Patient not taking: Reported on 12/21/2013 09/24/13   Juliet RudeNathan R. Pickering, MD  traMADol (ULTRAM) 50 MG tablet Take 2 tablets (100 mg total) by mouth every 8 (eight) hours as needed. 12/21/13 12/31/13  Arthor CaptainAbigail Tine Mabee, PA-C   BP 180/122 mmHg  Pulse 63  Temp(Src) 98.2 F (  36.8 C) (Oral)  Resp 22  Ht 5\' 9"  (1.753 m)  Wt 141 lb (63.957 kg)  BMI 20.81 kg/m2  SpO2 100% Physical Exam  Constitutional: He appears well-developed and well-nourished. No distress.  HENT:  Head: Normocephalic and atraumatic.    Eyes: Conjunctivae are normal. No scleral icterus.  Neck: Normal range of motion. Neck supple.  Cardiovascular: Normal rate, regular rhythm and normal heart sounds.   Pulmonary/Chest: Effort normal and breath sounds normal. No respiratory distress.  Abdominal: Soft. There is no  tenderness.  Musculoskeletal: He exhibits no edema.  Neurological: He is alert.  Skin: Skin is warm and dry. He is not diaphoretic.  Psychiatric: His behavior is normal.  Nursing note and vitals reviewed.   ED Course  Procedures (including critical care time) Labs Review Labs Reviewed  CBC WITH DIFFERENTIAL - Abnormal; Notable for the following:    Hemoglobin 12.3 (*)    HCT 37.4 (*)    Platelets 149 (*)    Neutrophils Relative % 20 (*)    Neutro Abs 1.2 (*)    Lymphocytes Relative 66 (*)    Lymphs Abs 4.2 (*)    All other components within normal limits  COMPREHENSIVE METABOLIC PANEL - Abnormal; Notable for the following:    Total Bilirubin <0.2 (*)    All other components within normal limits  URINALYSIS, ROUTINE W REFLEX MICROSCOPIC    Imaging Review No results found.   EKG Interpretation None      MDM   Final diagnoses:  Occipital neuralgia of right side  Essential hypertension     BP 180/122 mmHg  Pulse 63  Temp(Src) 98.2 F (36.8 C) (Oral)  Resp 22  Ht 5\' 9"  (1.753 m)  Wt 141 lb (63.957 kg)  BMI 20.81 kg/m2  SpO2 100% The patient is hypertensive. This appears to be occipital neuralgia. Patient has consented to nerve block.   Patient with immediate relief of symptoms with occipital nerve block. Discussed risks and benefits and reasons to seek immediate medical care. Follow up with PCP. Tramadol at discharge. Pt HA treated and improved while in ED.  Presentation is not concerning for [facility], ICH, Meningitis, or temporal arteritis. Pt is afebrile with no focal neuro deficits, nuchal rigidity, or change in vision. \Pt verbalizes understanding and is agreeable with plan to dc.     Arthor Captainbigail Anothy Bufano, PA-C 12/21/13 [id number]  Warnell Foresterrey Wofford, MD 12/21/13 [phone]

## 2013-12-28 ENCOUNTER — Encounter: Payer: Self-pay | Admitting: Internal Medicine

## 2013-12-29 ENCOUNTER — Telehealth: Payer: Self-pay | Admitting: *Deleted

## 2013-12-29 ENCOUNTER — Encounter: Payer: Self-pay | Admitting: Internal Medicine

## 2013-12-29 NOTE — Telephone Encounter (Signed)
Received a call from Christiane HaJonathan about his Viagra.  Told him his medication was here and ready to be picked up.

## 2014-01-03 ENCOUNTER — Encounter: Payer: Self-pay | Admitting: Internal Medicine

## 2014-02-01 ENCOUNTER — Other Ambulatory Visit: Payer: Self-pay | Admitting: Licensed Clinical Social Worker

## 2014-02-01 ENCOUNTER — Ambulatory Visit (INDEPENDENT_AMBULATORY_CARE_PROVIDER_SITE_OTHER): Payer: Medicare Other | Admitting: Infectious Disease

## 2014-02-01 ENCOUNTER — Ambulatory Visit (INDEPENDENT_AMBULATORY_CARE_PROVIDER_SITE_OTHER): Payer: Medicare Other | Admitting: Licensed Clinical Social Worker

## 2014-02-01 ENCOUNTER — Encounter: Payer: Self-pay | Admitting: Infectious Disease

## 2014-02-01 ENCOUNTER — Telehealth: Payer: Self-pay | Admitting: *Deleted

## 2014-02-01 ENCOUNTER — Other Ambulatory Visit: Payer: Self-pay | Admitting: *Deleted

## 2014-02-01 VITALS — BP 133/84 | HR 62 | Temp 97.9°F | Wt 130.0 lb

## 2014-02-01 DIAGNOSIS — Z23 Encounter for immunization: Secondary | ICD-10-CM

## 2014-02-01 DIAGNOSIS — B20 Human immunodeficiency virus [HIV] disease: Secondary | ICD-10-CM

## 2014-02-01 DIAGNOSIS — Z79899 Other long term (current) drug therapy: Secondary | ICD-10-CM

## 2014-02-01 DIAGNOSIS — Z113 Encounter for screening for infections with a predominantly sexual mode of transmission: Secondary | ICD-10-CM

## 2014-02-01 DIAGNOSIS — N522 Drug-induced erectile dysfunction: Secondary | ICD-10-CM

## 2014-02-01 DIAGNOSIS — R634 Abnormal weight loss: Secondary | ICD-10-CM

## 2014-02-01 DIAGNOSIS — I10 Essential (primary) hypertension: Secondary | ICD-10-CM

## 2014-02-01 LAB — LIPID PANEL
CHOLESTEROL: 100 mg/dL (ref 0–200)
HDL: 32 mg/dL — AB (ref >39)
LDL CALC: 60 mg/dL (ref 0–99)
TRIGLYCERIDES: 38 mg/dL (ref <150)
Total CHOL/HDL Ratio: 3.1 Ratio
VLDL: 8 mg/dL (ref 0–40)

## 2014-02-01 LAB — CBC WITH DIFFERENTIAL/PLATELET
Basophils Absolute: 0 10*3/uL (ref 0.0–0.1)
Basophils Relative: 0 % (ref 0–1)
EOS ABS: 0.1 10*3/uL (ref 0.0–0.7)
EOS PCT: 3 % (ref 0–5)
HCT: 40.4 % (ref 39.0–52.0)
Hemoglobin: 13.2 g/dL (ref 13.0–17.0)
Lymphocytes Relative: 60 % — ABNORMAL HIGH (ref 12–46)
Lymphs Abs: 2.7 10*3/uL (ref 0.7–4.0)
MCH: 28.6 pg (ref 26.0–34.0)
MCHC: 32.7 g/dL (ref 30.0–36.0)
MCV: 87.4 fL (ref 78.0–100.0)
MPV: 10.4 fL (ref 8.6–12.4)
Monocytes Absolute: 0.3 10*3/uL (ref 0.1–1.0)
Monocytes Relative: 6 % (ref 3–12)
NEUTROS ABS: 1.4 10*3/uL — AB (ref 1.7–7.7)
NEUTROS PCT: 31 % — AB (ref 43–77)
PLATELETS: 242 10*3/uL (ref 150–400)
RBC: 4.62 MIL/uL (ref 4.22–5.81)
RDW: 14.7 % (ref 11.5–15.5)
WBC: 4.5 10*3/uL (ref 4.0–10.5)

## 2014-02-01 LAB — COMPLETE METABOLIC PANEL WITH GFR
ALK PHOS: 81 U/L (ref 39–117)
ALT: 11 U/L (ref 0–53)
AST: 14 U/L (ref 0–37)
Albumin: 3.9 g/dL (ref 3.5–5.2)
BUN: 10 mg/dL (ref 6–23)
CALCIUM: 8.9 mg/dL (ref 8.4–10.5)
CO2: 26 meq/L (ref 19–32)
Chloride: 108 mEq/L (ref 96–112)
Creat: 0.69 mg/dL (ref 0.50–1.35)
GLUCOSE: 60 mg/dL — AB (ref 70–99)
Potassium: 4.5 mEq/L (ref 3.5–5.3)
Sodium: 141 mEq/L (ref 135–145)
TOTAL PROTEIN: 6.5 g/dL (ref 6.0–8.3)
Total Bilirubin: 0.3 mg/dL (ref 0.2–1.2)

## 2014-02-01 MED ORDER — SILDENAFIL CITRATE 50 MG PO TABS: 50.0000 mg | ORAL_TABLET | Freq: Every day | ORAL | Status: AC | PRN

## 2014-02-01 MED ORDER — ABACAVIR-DOLUTEGRAVIR-LAMIVUD 600-50-300 MG PO TABS: 1.0000 | ORAL_TABLET | Freq: Every day | ORAL | Status: DC

## 2014-02-01 MED ORDER — MEGESTROL ACETATE 400 MG/10ML PO SUSP: 400.0000 mg | Freq: Two times a day (BID) | ORAL | Status: DC

## 2014-02-01 NOTE — Progress Notes (Signed)
  Subjective:    Patient ID: [name], male    DOB: January 13, 1986, 29 y.o.   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 While in the trial he had perfect virological suppression and healthy CD4 count and is currently nicely suppressed currently o Prezcobix and Truvada   Lab Results  Component Value Date   HIV1RNAQUANT <20 09/28/2013   Lab Results  Component Value Date   CD4TABS 770 09/28/2013   CD4TABS 950 04/25/2013   CD4TABS 860 10/25/2012   We reviewed his past regimens and prior VL and CD4 counts. He only had one period of viremia it appears after he came out of study and there appears to have been a lapse in ADAP. He never had any documented resistance.  I proposed changing him to [facility] to see if this might help him with his poor appetite that has not responded to marinol but previously did to megace.   .Review of Systems  Constitutional: Positive for appetite change and unexpected weight change. Negative for chills, diaphoresis, activity change and fatigue.  HENT: Negative for congestion, rhinorrhea, sinus pressure, sneezing, sore throat and trouble swallowing.   Eyes: Negative for photophobia and visual disturbance.  Respiratory: Negative for cough, chest tightness, shortness of breath, wheezing and stridor.   Cardiovascular: Positive for chest pain. Negative for palpitations and leg swelling.  Gastrointestinal: Negative for nausea, vomiting, diarrhea, constipation, blood in stool, abdominal distention and anal bleeding.  Genitourinary: Negative for hematuria, flank pain and difficulty urinating.  Musculoskeletal: Positive for myalgias and joint swelling. Negative for arthralgias and gait problem.  Skin: Negative for color change, pallor, rash and wound.  Neurological: Negative for dizziness, tremors and light-headedness.  Hematological:  Negative for adenopathy. Does not bruise/bleed easily.  Psychiatric/Behavioral: Negative for behavioral problems, confusion, sleep disturbance, dysphoric mood, decreased concentration and agitation.       Objective:   Physical Exam  Constitutional: He is oriented to person, place, and time. He appears well-developed and well-nourished.  HENT:  Head: Normocephalic and atraumatic.  Eyes: Conjunctivae and EOM are normal.  Neck: Normal range of motion. Neck supple.  Cardiovascular: Normal rate and regular rhythm.   Pulmonary/Chest: Effort normal. No respiratory distress. He has no wheezes.  Abdominal: Soft. He exhibits no distension.  Musculoskeletal: Normal range of motion. He exhibits no edema or tenderness.  Neurological: He is alert and oriented to person, place, and time.  Skin: Skin is warm and dry. No rash noted. No erythema. No pallor.  Psychiatric: He has a normal mood and affect. His behavior is normal. Judgment and thought content normal.          Assessment & Plan   HIV: HIV   HIV: change to TRIUMEQ. Check labs today and in one month. I spent greater than 25 minutes with the patient including greater than 50% of time in face to face counsel of the patient and in coordination of their care.    HTN: continue  ACEI  Poor appetite: will give him megace again but hoping changing his ARVS may help. I dont want to use megace long term

## 2014-02-01 NOTE — Telephone Encounter (Signed)
Faxed application to [employer] today for [employer] System[employer]'s Viagra.

## 2014-02-02 LAB — T-HELPER CELL (CD4) - (RCID CLINIC ONLY)
CD4 % Helper T Cell: 28 % — ABNORMAL LOW (ref 33–55)
CD4 T Cell Abs: 780 /uL (ref 400–2700)

## 2014-02-02 LAB — RPR

## 2014-02-03 LAB — HIV-1 RNA QUANT-NO REFLEX-BLD: HIV-1 RNA Quant, Log: <1.3 {Log} (ref <1.30)

## 2014-02-07 ENCOUNTER — Telehealth: Payer: Self-pay | Admitting: *Deleted

## 2014-02-07 NOTE — Telephone Encounter (Signed)
I called [employer] to check status on [name]'s application.  It will take 10 days for the application to be processed.  The medication will be mailed here upon approval of his application.

## 2014-03-01 ENCOUNTER — Ambulatory Visit: Payer: Medicaid Other | Admitting: Infectious Disease

## 2014-04-25 ENCOUNTER — Encounter (HOSPITAL_COMMUNITY): Payer: Self-pay | Admitting: Emergency Medicine

## 2014-04-25 ENCOUNTER — Emergency Department (HOSPITAL_COMMUNITY)
Admission: EM | Admit: 2014-04-25 | Discharge: 2014-04-26 | Disposition: A | Discharge status: Home / Self Care | Payer: Medicare Other | Attending: Emergency Medicine | Admitting: Emergency Medicine

## 2014-04-25 ENCOUNTER — Emergency Department (HOSPITAL_COMMUNITY): Payer: Medicare Other

## 2014-04-25 DIAGNOSIS — Z79899 Other long term (current) drug therapy: Secondary | ICD-10-CM | POA: Diagnosis not present

## 2014-04-25 DIAGNOSIS — Z7951 Long term (current) use of inhaled steroids: Secondary | ICD-10-CM | POA: Insufficient documentation

## 2014-04-25 DIAGNOSIS — N50811 Right testicular pain: Secondary | ICD-10-CM

## 2014-04-25 DIAGNOSIS — N451 Epididymitis: Secondary | ICD-10-CM | POA: Insufficient documentation

## 2014-04-25 DIAGNOSIS — I10 Essential (primary) hypertension: Secondary | ICD-10-CM | POA: Insufficient documentation

## 2014-04-25 DIAGNOSIS — Z21 Asymptomatic human immunodeficiency virus [HIV] infection status: Secondary | ICD-10-CM | POA: Insufficient documentation

## 2014-04-25 DIAGNOSIS — Z720 Tobacco use: Secondary | ICD-10-CM | POA: Insufficient documentation

## 2014-04-25 DIAGNOSIS — J45909 Unspecified asthma, uncomplicated: Secondary | ICD-10-CM | POA: Insufficient documentation

## 2014-04-25 DIAGNOSIS — R109 Unspecified abdominal pain: Secondary | ICD-10-CM | POA: Diagnosis present

## 2014-04-25 LAB — BASIC METABOLIC PANEL
Anion gap: 11 (ref 5–15)
BUN: 7 mg/dL (ref 6–23)
CHLORIDE: 106 mmol/L (ref 96–112)
CO2: 20 mmol/L (ref 19–32)
Calcium: 9.4 mg/dL (ref 8.4–10.5)
Creatinine, Ser: 0.76 mg/dL (ref 0.50–1.35)
GFR calc non Af Amer: >90 mL/min (ref >90)
Glucose, Bld: 93 mg/dL (ref 70–99)
Potassium: 4 mmol/L (ref 3.5–5.1)
Sodium: 137 mmol/L (ref 135–145)

## 2014-04-25 LAB — CBC WITH DIFFERENTIAL/PLATELET
BASOS ABS: 0 10*3/uL (ref 0.0–0.1)
Basophils Relative: 0 % (ref 0–1)
Eosinophils Absolute: 0.1 10*3/uL (ref 0.0–0.7)
Eosinophils Relative: 1 % (ref 0–5)
HEMATOCRIT: 37.7 % — AB (ref 39.0–52.0)
HEMOGLOBIN: 13 g/dL (ref 13.0–17.0)
LYMPHS ABS: 3.8 10*3/uL (ref 0.7–4.0)
Lymphocytes Relative: 44 % (ref 12–46)
MCH: 29.1 pg (ref 26.0–34.0)
MCHC: 34.5 g/dL (ref 30.0–36.0)
MCV: 84.5 fL (ref 78.0–100.0)
Monocytes Absolute: 0.6 10*3/uL (ref 0.1–1.0)
Monocytes Relative: 7 % (ref 3–12)
Neutro Abs: 4.3 10*3/uL (ref 1.7–7.7)
Neutrophils Relative %: 48 % (ref 43–77)
Platelets: 218 10*3/uL (ref 150–400)
RBC: 4.46 MIL/uL (ref 4.22–5.81)
RDW: 13.9 % (ref 11.5–15.5)
WBC: 8.8 10*3/uL (ref 4.0–10.5)

## 2014-04-25 MED ORDER — MORPHINE SULFATE 4 MG/ML IJ SOLN
4.0000 mg | Freq: Once | INTRAMUSCULAR | Status: AC
  Administered 2014-04-25: 4 mg via INTRAVENOUS
  Filled 2014-04-25: qty 1

## 2014-04-25 NOTE — ED Notes (Signed)
Pt sts right groin pain x 4 days; pt sts possible seizures [place]

## 2014-04-25 NOTE — ED Provider Notes (Signed)
CSN: [id number]     Arrival date & time 04/25/14  1850 History   First MD Initiated Contact with Patient 04/25/14 2134     Chief Complaint  Patient presents with  . Groin Pain    HPI Patient presents to the emergency room with complaints of groin pain on the right side for the last 4 days. Patient has noticed a bump in the area. He feels like there is a knot that is moving. He denies any fevers or chills no vomiting or diarrhea. No dysuria.  Patient also states he may be having seizures. He was evaluated in another hospital 2 days ago. The patient states he was told he should be admitted for observation. [place] not want [place] stay in the [place] and wanted to come home for evaluation. Patient has not had a seizure today.  Past Medical History  Diagnosis Date  . HIV disease     CD4 488, viral load undetectable on 12/2010  . Bronchitis   . Asthma   . Hypertension    Past Surgical History  Procedure Laterality Date  . Hand surgery     Family History  Problem Relation Age of Onset  . Hypertension Mother   . Diabetes Mother    History  Substance Use Topics  . Smoking status: Current Some Day Smoker -- 0.30 packs/day    Types: Cigarettes  . Smokeless tobacco: Never Used  . Alcohol Use: No    Review of Systems  All other systems reviewed and are negative.     Allergies  Review of patient's allergies indicates no known allergies.  Home Medications   Prior to Admission medications   Medication Sig Start Date End Date Taking? Authorizing Provider  Abacavir-Dolutegravir-Lamivud (TRIUMEQ) 600-50-300 MG TABS Take 1 tablet by mouth daily. 02/01/14   Randall Hissornelius N Van Dam, MD  albuterol (PROVENTIL HFA;VENTOLIN HFA) 108 (90 BASE) MCG/ACT inhaler Inhale 1-2 puffs into the lungs every 6 (six) hours as needed for wheezing or shortness of breath.    Historical Provider, MD  beclomethasone (QVAR) 80 MCG/ACT inhaler Inhale 1 puff into the lungs 2 (two) times daily. 09/28/13    Randall Hissornelius N Van Dam, MD  doxycycline (VIBRAMYCIN) 100 MG capsule Take 1 capsule (100 mg total) by mouth 2 (two) times daily. 04/26/14   Linwood DibblesJon Marquon Alcala, MD  HYDROcodone-acetaminophen (NORCO/VICODIN) 5-325 MG per tablet Take 1-2 tablets by mouth every 4 (four) hours as needed. 04/26/14   Linwood DibblesJon Auriella Wieand, MD  hydrocortisone cream 1 % Apply to affected area 2 times daily Patient not taking: Reported on 12/21/2013 11/26/13   Marisa Severinlga Otter, MD  lisinopril (PRINIVIL,ZESTRIL) 20 MG tablet Take 1 tablet (20 mg total) by mouth daily. 09/28/13   Randall Hissornelius N Van Dam, MD  megestrol (MEGACE) 400 MG/10ML suspension Take 10 mLs (400 mg total) by mouth 2 (two) times daily. 02/01/14   Randall Hissornelius N Van Dam, MD  sildenafil (VIAGRA) 50 MG tablet Take 1 tablet (50 mg total) by mouth daily as needed for erectile dysfunction. 02/01/14   Randall Hissornelius N Van Dam, MD   BP 125/109 mmHg  Pulse 72  Temp(Src) 98.4 F (36.9 C) (Oral)  Resp 13  SpO2 100% Physical Exam  Constitutional: He appears well-developed and well-nourished. No distress.  HENT:  Head: Normocephalic and atraumatic.  Right Ear: External ear normal.  Left Ear: External ear normal.  Eyes: Conjunctivae are normal. Right eye exhibits no discharge. Left eye exhibits no discharge. No scleral icterus.  Neck: Neck supple. No  tracheal deviation present.  Cardiovascular: Normal rate, regular rhythm and intact distal pulses.   Pulmonary/Chest: Effort normal and breath sounds normal. No stridor. No respiratory distress. He has no wheezes. He has no rales.  Abdominal: Soft. Bowel sounds are normal. He exhibits no distension. There is no tenderness. There is no rebound and no guarding.  Genitourinary: Right testis shows tenderness. Right testis shows no mass and no swelling. Right testis is descended. Cremasteric reflex is not absent on the right side. Left testis shows no mass, no swelling and no tenderness. Left testis is descended. Cremasteric reflex is not absent on the left side.   Musculoskeletal: He exhibits no edema or tenderness.  Neurological: He is alert. He has normal strength. No cranial nerve deficit (no facial droop, extraocular movements intact, no slurred speech) or sensory deficit. He exhibits normal muscle tone. He displays no seizure activity. Coordination normal.  Skin: Skin is warm and dry. No rash noted.  Psychiatric: He has a normal mood and affect.  Nursing note and vitals reviewed.   ED Course  Procedures (including critical care time) Labs Review Labs Reviewed  CBC WITH DIFFERENTIAL/PLATELET - Abnormal; Notable for the following:    HCT 37.7 (*)    All other components within normal limits  URINALYSIS, ROUTINE W REFLEX MICROSCOPIC - Abnormal; Notable for the following:    Color, Urine AMBER (*)    APPearance CLOUDY (*)    Leukocytes, UA MODERATE (*)    All other components within normal limits  URINE MICROSCOPIC-ADD ON - Abnormal; Notable for the following:    Bacteria, UA FEW (*)    All other components within normal limits  URINE CULTURE  BASIC METABOLIC PANEL  RPR  HIV ANTIBODY (ROUTINE TESTING)  GC/CHLAMYDIA PROBE AMP ([place])    Imaging Review US Scrotum  04/25/2014   CLINICAL DATA:  Acute onset of right testicular pain for 4 days, worse at the right groin. Status post seizures. Initial encounter.  EXAM: SCROTAL ULTRASOUND  DOPPLER ULTRASOUND OF THE TESTICLES  TECHNIQUE: Complete ultrasound examination of the testicles, epididymis, and other scrotal structures was performed. Color and spectral Doppler ultrasound were also utilized to evaluate blood flow to the testicles.  COMPARISON:  None.  FINDINGS: Right testicle  Measurements: 4.1 x 2.2 x 2.7 cm. No mass or microlithiasis visualized.  Left testicle  Measurements: 3.8 x 2.1 x 2.5 cm. No mass or microlithiasis visualized.  Right epididymis:  Normal in size and appearance.  Left epididymis:  Normal in size and appearance.  Hydrocele:  None visualized.  Varicocele:  None  visualized.  Pulsed Doppler interrogation of both testes demonstrates normal low resistance arterial and venous waveforms bilaterally.  Small bilateral inguinal hernias are suggested, containing only fat. There is no evidence of hyperemia.  IMPRESSION: 1. Unremarkable scrotal ultrasound. No evidence of testicular torsion. 2. Suggestion of small bilateral inguinal hernias, containing only fat.   Electronically Signed   By: Roanna Raider M.D.   On: 04/25/2014 22:54   [place] Art/ven Flow Abd Pelv Doppler  04/25/2014   CLINICAL DATA:  Acute onset of right testicular pain for 4 days, worse at the right groin. Status post seizures. Initial encounter.  EXAM: SCROTAL ULTRASOUND  DOPPLER ULTRASOUND OF THE TESTICLES  TECHNIQUE: Complete ultrasound examination of the testicles, epididymis, and other scrotal structures was performed. Color and spectral Doppler ultrasound were also utilized to evaluate blood flow to the testicles.  COMPARISON:  None.  FINDINGS: Right testicle  Measurements: 4.1 x 2.2 x  2.7 cm. No mass or microlithiasis visualized.  Left testicle  Measurements: 3.8 x 2.1 x 2.5 cm. No mass or microlithiasis visualized.  Right epididymis:  Normal in size and appearance.  Left epididymis:  Normal in size and appearance.  Hydrocele:  None visualized.  Varicocele:  None visualized.  Pulsed Doppler interrogation of both testes demonstrates normal low resistance arterial and venous waveforms bilaterally.  Small bilateral inguinal hernias are suggested, containing only fat. There is no evidence of hyperemia.  IMPRESSION: 1. Unremarkable scrotal ultrasound. No evidence of testicular torsion. 2. Suggestion of small bilateral inguinal hernias, containing only fat.   Electronically Signed   By: Roanna Raider M.D.   On: 04/25/2014 22:54    Medications  cefTRIAXone (ROCEPHIN) injection 250 mg (not administered)  morphine 4 MG/ML injection 4 mg (4 mg Intravenous Given 04/25/14 2314)     MDM   Final diagnoses:  Pain  in right testicle  Epididymitis    Sx and exam are consistent with epididymitis. Patient was given a dose of ceftriaxone. I will discharge home on a course of doxycycline.  he mentions being evaluated for seizures at another hospital a few days ago. I will give him an outpatient referral to neurologist in the area.    Linwood Dibbles, MD 04/26/14 [phone]

## 2014-04-26 DIAGNOSIS — N451 Epididymitis: Secondary | ICD-10-CM | POA: Diagnosis not present

## 2014-04-26 LAB — URINALYSIS, ROUTINE W REFLEX MICROSCOPIC
Bilirubin Urine: NEGATIVE
Glucose, UA: NEGATIVE mg/dL
Hgb urine dipstick: NEGATIVE
Ketones, ur: NEGATIVE mg/dL
NITRITE: NEGATIVE
Protein, ur: NEGATIVE mg/dL
Specific Gravity, Urine: 1.027 (ref 1.005–1.030)
Urobilinogen, UA: 1 mg/dL (ref 0.0–1.0)
pH: 6.5 (ref 5.0–8.0)

## 2014-04-26 LAB — URINE MICROSCOPIC-ADD ON

## 2014-04-26 LAB — RPR: RPR Ser Ql: NONREACTIVE

## 2014-04-26 LAB — GC/CHLAMYDIA PROBE AMP (~~LOC~~) NOT AT ARMC
Chlamydia: NEGATIVE
NEISSERIA GONORRHEA: POSITIVE — AB

## 2014-04-26 MED ORDER — HYDROCODONE-ACETAMINOPHEN 5-325 MG PO TABS: 1.0000 | ORAL_TABLET | ORAL | Status: DC | PRN

## 2014-04-26 MED ORDER — DOXYCYCLINE HYCLATE 100 MG PO CAPS: 100.0000 mg | ORAL_CAPSULE | Freq: Two times a day (BID) | ORAL | Status: DC

## 2014-04-26 MED ORDER — CEFTRIAXONE SODIUM 250 MG IJ SOLR
250.0000 mg | Freq: Once | INTRAMUSCULAR | Status: AC
Start: 2014-04-26 — End: 2014-04-26
  Administered 2014-04-26: 250 mg via INTRAMUSCULAR
  Filled 2014-04-26: qty 250

## 2014-04-26 MED ORDER — LIDOCAINE HCL (PF) 1 % IJ SOLN
2.0000 mL | Freq: Once | INTRAMUSCULAR | Status: AC
  Administered 2014-04-26: 2 mL
  Filled 2014-04-26: qty 5

## 2014-04-26 NOTE — ED Notes (Signed)
Discharged home.  No s/s of reaction noted to Rocephin injection.

## 2014-04-26 NOTE — Discharge Instructions (Signed)
Epididymitis °Epididymitis is a swelling (inflammation) of the epididymis. The epididymis is a cord-like structure along the back part of the testicle. Epididymitis is usually, but not always, caused by infection. This is usually a sudden problem beginning with chills, fever and pain behind the scrotum and in the testicle. There may be swelling and redness of the testicle. °DIAGNOSIS  °Physical examination will reveal a tender, swollen epididymis. Sometimes, cultures are obtained from the urine or from prostate secretions to help find out if there is an infection or if the cause is a different problem. Sometimes, blood work is performed to see if your white blood cell count is elevated and if a germ (bacterial) or viral infection is present. Using this knowledge, an appropriate medicine which kills germs (antibiotic) can be chosen by your caregiver. A viral infection causing epididymitis will most often go away (resolve) without treatment. °HOME CARE INSTRUCTIONS  °· Hot sitz baths for 20 minutes, 4 times per day, may help relieve pain. °· Only take over-the-counter or prescription medicines for pain, discomfort or fever as directed by your caregiver. °· Take all medicines, including antibiotics, as directed. Take the antibiotics for the full prescribed length of time even if you are feeling better. °· It is very important to keep all follow-up appointments. °SEEK IMMEDIATE MEDICAL CARE IF:  °· You have a fever. °· You have pain not relieved with medicines. °· You have any worsening of your problems. °· Your pain seems to come and go. °· You develop pain, redness, and swelling in the scrotum and surrounding areas. °MAKE SURE YOU:  °· Understand these instructions. °· Will watch your condition. °· Will get help right away if you are not doing well or get worse. °Document Released: 01/11/2000 Document Revised: 04/07/2011 Document Reviewed: 11/30/2008 °ExitCare® Patient Information ©2015 [employer]. This information  is not intended to replace advice given to you by your health care provider. Make sure you discuss any questions you have with your health care provider. ° °

## 2014-04-27 ENCOUNTER — Telehealth (HOSPITAL_COMMUNITY): Payer: Self-pay

## 2014-04-27 ENCOUNTER — Telehealth (HOSPITAL_BASED_OUTPATIENT_CLINIC_OR_DEPARTMENT_OTHER): Payer: Self-pay | Admitting: Emergency Medicine

## 2014-04-27 LAB — URINE CULTURE

## 2014-04-27 LAB — HIV 1/2 AB DIFFERENTIATION
HIV 1 Ab: REACTIVE — AB
HIV 2 AB: NONREACTIVE

## 2014-04-27 LAB — HIV ANTIBODY (ROUTINE TESTING W REFLEX): HIV Screen 4th Generation wRfx: REACTIVE — AB

## 2014-04-27 NOTE — ED Notes (Signed)
Positive for gonorrhea. Treated with doxycycline and rocephin. DHHS form faxed

## 2014-05-30 ENCOUNTER — Telehealth: Payer: Self-pay | Admitting: *Deleted

## 2014-05-30 NOTE — Telephone Encounter (Addendum)
Patient overdue for appointment, was supposed to follow up 02/2014.  Patient's viagra from patient assistance has arrived.  viagra 50  Mg #30 ndc [id number] lot [id number] exp 06/27/18 Andree CossHowell, Michelle M, RN

## 2014-10-18 ENCOUNTER — Other Ambulatory Visit: Payer: Self-pay | Admitting: Infectious Disease

## 2014-10-18 DIAGNOSIS — R634 Abnormal weight loss: Secondary | ICD-10-CM

## 2014-11-07 DIAGNOSIS — F1920 Other psychoactive substance dependence, uncomplicated: Secondary | ICD-10-CM | POA: Diagnosis not present

## 2014-11-08 DIAGNOSIS — Z5181 Encounter for therapeutic drug level monitoring: Secondary | ICD-10-CM | POA: Diagnosis not present

## 2014-11-08 DIAGNOSIS — F1920 Other psychoactive substance dependence, uncomplicated: Secondary | ICD-10-CM | POA: Diagnosis not present

## 2014-11-24 ENCOUNTER — Emergency Department (HOSPITAL_COMMUNITY)
Admission: EM | Admit: 2014-11-24 | Discharge: 2014-11-25 | Disposition: A | Discharge status: Home / Self Care | Payer: Medicare Other | Attending: Physician Assistant | Admitting: Physician Assistant

## 2014-11-24 ENCOUNTER — Encounter (HOSPITAL_COMMUNITY): Payer: Self-pay | Admitting: *Deleted

## 2014-11-24 DIAGNOSIS — I10 Essential (primary) hypertension: Secondary | ICD-10-CM | POA: Insufficient documentation

## 2014-11-24 DIAGNOSIS — J45909 Unspecified asthma, uncomplicated: Secondary | ICD-10-CM | POA: Diagnosis not present

## 2014-11-24 DIAGNOSIS — G44209 Tension-type headache, unspecified, not intractable: Secondary | ICD-10-CM

## 2014-11-24 DIAGNOSIS — Z79899 Other long term (current) drug therapy: Secondary | ICD-10-CM | POA: Insufficient documentation

## 2014-11-24 DIAGNOSIS — Z7951 Long term (current) use of inhaled steroids: Secondary | ICD-10-CM | POA: Diagnosis not present

## 2014-11-24 DIAGNOSIS — Z792 Long term (current) use of antibiotics: Secondary | ICD-10-CM | POA: Insufficient documentation

## 2014-11-24 DIAGNOSIS — Z21 Asymptomatic human immunodeficiency virus [HIV] infection status: Secondary | ICD-10-CM | POA: Insufficient documentation

## 2014-11-24 DIAGNOSIS — Z720 Tobacco use: Secondary | ICD-10-CM | POA: Diagnosis not present

## 2014-11-24 DIAGNOSIS — R51 Headache: Secondary | ICD-10-CM | POA: Diagnosis present

## 2014-11-24 MED ORDER — DIPHENHYDRAMINE HCL 25 MG PO CAPS
25.0000 mg | ORAL_CAPSULE | Freq: Once | ORAL | Status: AC
  Administered 2014-11-25: 25 mg via ORAL
  Filled 2014-11-24: qty 1

## 2014-11-24 MED ORDER — METOCLOPRAMIDE HCL 5 MG/ML IJ SOLN
10.0000 mg | Freq: Once | INTRAMUSCULAR | Status: AC
  Administered 2014-11-25: 10 mg via INTRAMUSCULAR
  Filled 2014-11-24: qty 2

## 2014-11-24 MED ORDER — DEXAMETHASONE SODIUM PHOSPHATE 10 MG/ML IJ SOLN
10.0000 mg | Freq: Once | INTRAMUSCULAR | Status: AC
  Administered 2014-11-25: 10 mg via INTRAMUSCULAR
  Filled 2014-11-24: qty 1

## 2014-11-24 NOTE — ED Notes (Signed)
Pt states that he has had headache for a week now,  Like he had 5 or 6 months ago when he says "cone gave him three shots in his brain on right side and three shots in his brain on left side"

## 2014-11-24 NOTE — ED Provider Notes (Signed)
CSN: [id number]     Arrival date & time 11/24/14  2216 History  By signing my name below, I, Doreatha Martin, attest that this documentation has been prepared under the direction and in the presence of Elpidio Anis, PA-C. Electronically Signed: Doreatha Martin, ED Scribe. 11/24/2014. 11:54 PM.     Chief Complaint  Patient presents with  . Headache   The history is provided by the patient. No language interpreter was used.    HPI Comments: [name] is a 29 y.o. male with h/o HTN, migraines who presents to the Emergency Department complaining of a moderate, waxing and waning, constant, throbbing HA to the occiput that radiates to the forehead onset a week ago. He states associated photophobia, nausea, vomiting. He has taken tylenol and ibuprofen with no relief. Pt reports that speaking worsens his HA. He was seen in the ED for a similar HA 6 months ago. He notes that he followed up with his PCP after this episode. NKDA. PCP is Dr. Daiva Eves. He denies fever, cough, congestion, rhinorrhea, sore throat, back pain, CP, abdominal pain.  Past Medical History  Diagnosis Date  . HIV disease (HCC)     CD4 488, viral load undetectable on 12/2010  . Bronchitis   . Asthma   . Hypertension    Past Surgical History  Procedure Laterality Date  . Hand surgery     Family History  Problem Relation Age of Onset  . Hypertension Mother   . Diabetes Mother    Social History  Substance Use Topics  . Smoking status: Current Some Day Smoker -- 0.30 packs/day    Types: Cigarettes  . Smokeless tobacco: Never Used  . Alcohol Use: No    Review of Systems  Constitutional: Negative for fever.  HENT: Negative for congestion, rhinorrhea and sore throat.   Eyes: Positive for photophobia.  Respiratory: Negative for cough.   Cardiovascular: Negative for chest pain.  Gastrointestinal: Positive for nausea and vomiting. Negative for abdominal pain.  Musculoskeletal: Negative for back pain.  Neurological:  Positive for headaches.   Allergies  Review of patient's allergies indicates no known allergies.  Home Medications   Prior to Admission medications   Medication Sig Start Date End Date Taking? Authorizing Provider  Abacavir-Dolutegravir-Lamivud (TRIUMEQ) 600-50-300 MG TABS Take 1 tablet by mouth daily. 02/01/14   Randall Hiss, MD  albuterol (PROVENTIL HFA;VENTOLIN HFA) 108 (90 BASE) MCG/ACT inhaler Inhale 1-2 puffs into the lungs every 6 (six) hours as needed for wheezing or shortness of breath.    Historical Provider, MD  doxycycline (VIBRAMYCIN) 100 MG capsule Take 1 capsule (100 mg total) by mouth 2 (two) times daily. 04/26/14   Linwood Dibbles, MD  HYDROcodone-acetaminophen (NORCO/VICODIN) 5-325 MG per tablet Take 1-2 tablets by mouth every 4 (four) hours as needed. 04/26/14   Linwood Dibbles, MD  hydrocortisone cream 1 % Apply to affected area 2 times daily Patient not taking: Reported on 12/21/2013 11/26/13   Marisa Severin, MD  lisinopril (PRINIVIL,ZESTRIL) 20 MG tablet Take 1 tablet (20 mg total) by mouth daily. 09/28/13   Randall Hiss, MD  megestrol (MEGACE) 40 MG/ML suspension TAKE BY MOUTH TWICE DAILY 10/18/14   Randall Hiss, MD  QVAR 80 MCG/ACT inhaler INHALE 1 PUFF BY MOUTH TWO TIMES DAILY 10/18/14   Randall Hiss, MD  sildenafil (VIAGRA) 50 MG tablet Take 1 tablet (50 mg total) by mouth daily as needed for erectile dysfunction. 02/01/14   Lisette Grinder  Daiva EvesVan Dam, MD   BP 136/92 mmHg  Pulse 80  Temp(Src) 98.1 F (36.7 C) (Oral)  Resp 20  Ht 5\' 9"  (1.753 m)  Wt 145 lb (65.772 kg)  BMI 21.40 kg/m2  SpO2 100% Physical Exam  Constitutional: He is oriented to person, place, and time. He appears well-developed and well-nourished.  HENT:  Head: Normocephalic and atraumatic.  Eyes: Conjunctivae and EOM are normal. Pupils are equal, round, and reactive to light.  Neck: Normal range of motion. Neck supple.  Cardiovascular: Normal rate.   Pulmonary/Chest: Effort normal. No  respiratory distress.  Abdominal: He exhibits no distension.  Musculoskeletal: Normal range of motion.  Neurological: He is alert and oriented to person, place, and time.  Skin: Skin is warm and dry.  Psychiatric: He has a normal mood and affect. His behavior is normal.  Nursing note and vitals reviewed.  ED Course  Procedures (including critical care time) DIAGNOSTIC STUDIES: Oxygen Saturation is 100% on RA, normal by my interpretation.    COORDINATION OF CARE: 11:52 PM Discussed treatment plan with pt at bedside and pt agreed to plan.   MDM   Final diagnoses:  None    1. Nonspecific headache  No neurologic abnormalities on exam. VSS, afebrile. He is better with headache cocktail medications and is ready to go home. He is encouraged to follow up with PCP. I personally performed the services described in this documentation, which was scribed in my presence. The recorded information has been reviewed and is accurate.     Elpidio AnisShari Ciel Yanes, PA-C 11/27/14 [id number]  Lyndal Pulleyaniel Knott, MD 11/28/14 1007

## 2014-11-25 DIAGNOSIS — G44209 Tension-type headache, unspecified, not intractable: Secondary | ICD-10-CM | POA: Diagnosis not present

## 2014-11-25 NOTE — Discharge Instructions (Signed)
Tension Headache A tension headache is pain, pressure, or aching that is felt over the front and sides of your head. These headaches can last from 30 minutes to several days. HOME CARE Managing Pain  Take over-the-counter and prescription medicines only as told by your doctor.  Lie down in a dark, quiet room when you have a headache.  If directed, apply ice to your head and neck area:  Put ice in a plastic bag.  Place a towel between your skin and the bag.  Leave the ice on for 20 minutes, 2-3 times per day.  Use a heating pad or a hot shower to apply heat to your head and neck area as told by your doctor. Eating and Drinking  Eat meals on a regular schedule.  Do not drink a lot of alcohol.  Do not use a lot of caffeine, or stop using caffeine. General Instructions  Keep all follow-up visits as told by your doctor. This is important.  Keep a journal to find out if certain things bring on headaches. For example, write down:  What you eat and drink.  How much sleep you get.  Any change to your diet or medicines.  Try getting a massage, or doing other things that help you to relax.  Lessen stress.  Sit up straight. Do not tighten (tense) your muscles.  Do not use tobacco products. This includes cigarettes, chewing tobacco, or e-cigarettes. If you need help quitting, ask your doctor.  Exercise regularly as told by your doctor.  Get enough sleep. This may mean 7-9 hours of sleep. GET HELP IF:  Your symptoms are not helped by medicine.  You have a headache that feels different from your usual headache.  You feel sick to your stomach (nauseous) or you throw up (vomit).  You have a fever. GET HELP RIGHT AWAY IF:  Your headache becomes very bad.  You keep throwing up.  You have a stiff neck.  You have trouble seeing.  You have trouble speaking.  You have pain in your eye or ear.  Your muscles are weak or you lose muscle control.  You lose your balance  or you have trouble walking.  You feel like you will pass out (faint) or you pass out.  You have confusion.   This information is not intended to replace advice given to you by your health care provider. Make sure you discuss any questions you have with your health care provider.   Document Released: 04/09/2009 Document Revised: 10/04/2014 Document Reviewed: 05/08/2014 Elsevier Interactive Patient Education 2016 [employer]  

## 2014-12-07 ENCOUNTER — Emergency Department (HOSPITAL_COMMUNITY)
Admission: EM | Admit: 2014-12-07 | Discharge: 2014-12-07 | Disposition: A | Discharge status: Home / Self Care | Payer: Medicare Other | Attending: Emergency Medicine | Admitting: Emergency Medicine

## 2014-12-07 ENCOUNTER — Encounter (HOSPITAL_COMMUNITY): Payer: Self-pay | Admitting: Emergency Medicine

## 2014-12-07 DIAGNOSIS — S20469A Insect bite (nonvenomous) of unspecified back wall of thorax, initial encounter: Secondary | ICD-10-CM | POA: Diagnosis not present

## 2014-12-07 DIAGNOSIS — Z720 Tobacco use: Secondary | ICD-10-CM | POA: Insufficient documentation

## 2014-12-07 DIAGNOSIS — J45909 Unspecified asthma, uncomplicated: Secondary | ICD-10-CM | POA: Insufficient documentation

## 2014-12-07 DIAGNOSIS — Y9389 Activity, other specified: Secondary | ICD-10-CM | POA: Diagnosis not present

## 2014-12-07 DIAGNOSIS — S60561A Insect bite (nonvenomous) of right hand, initial encounter: Secondary | ICD-10-CM | POA: Diagnosis not present

## 2014-12-07 DIAGNOSIS — S1086XA Insect bite of other specified part of neck, initial encounter: Secondary | ICD-10-CM | POA: Diagnosis not present

## 2014-12-07 DIAGNOSIS — S40862A Insect bite (nonvenomous) of left upper arm, initial encounter: Secondary | ICD-10-CM | POA: Diagnosis not present

## 2014-12-07 DIAGNOSIS — I10 Essential (primary) hypertension: Secondary | ICD-10-CM | POA: Insufficient documentation

## 2014-12-07 DIAGNOSIS — Y9289 Other specified places as the place of occurrence of the external cause: Secondary | ICD-10-CM | POA: Diagnosis not present

## 2014-12-07 DIAGNOSIS — S40861A Insect bite (nonvenomous) of right upper arm, initial encounter: Secondary | ICD-10-CM | POA: Diagnosis not present

## 2014-12-07 DIAGNOSIS — W57XXXA Bitten or stung by nonvenomous insect and other nonvenomous arthropods, initial encounter: Secondary | ICD-10-CM | POA: Diagnosis not present

## 2014-12-07 DIAGNOSIS — S20369A Insect bite (nonvenomous) of unspecified front wall of thorax, initial encounter: Secondary | ICD-10-CM | POA: Diagnosis not present

## 2014-12-07 DIAGNOSIS — Z21 Asymptomatic human immunodeficiency virus [HIV] infection status: Secondary | ICD-10-CM | POA: Diagnosis not present

## 2014-12-07 DIAGNOSIS — S60562A Insect bite (nonvenomous) of left hand, initial encounter: Secondary | ICD-10-CM | POA: Diagnosis not present

## 2014-12-07 DIAGNOSIS — Z79899 Other long term (current) drug therapy: Secondary | ICD-10-CM | POA: Insufficient documentation

## 2014-12-07 DIAGNOSIS — Y998 Other external cause status: Secondary | ICD-10-CM | POA: Insufficient documentation

## 2014-12-07 MED ORDER — PERMETHRIN 5 % EX CREA
TOPICAL_CREAM | CUTANEOUS | Status: AC
Start: 2014-12-07 — End: ?

## 2014-12-07 NOTE — ED Notes (Signed)
Patient states bed bugs.   [facility] [place] [facility] [place] and broke out 1 x days ago on neck, back, hands.  Patient states has not actually seen the bed bugs, but is sure that he was exposed.

## 2014-12-07 NOTE — ED Provider Notes (Signed)
CSN: [id number]     Arrival date & time 12/07/14  1011 History  By signing my name below, I, Emmanuella Mensah, attest that this documentation has been prepared under the direction and in the presence of Sharilyn SitesLisa Eldwin Volkov, PA-C. Electronically Signed: Angelene GiovanniEmmanuella Mensah, ED Scribe. 12/07/2014. 10:29 AM.     Chief Complaint  Patient presents with  . insect bites    The history is provided by the patient. No language interpreter was used.   HPI Comments: [name] is a 29 y.o. male with a hx of HIV, bronchitis, asthma, and HTN who presents to the Emergency Department complaining of a generalized itchy spreading rash on his upper body which could be consistent with bed bugs onset 2 days ago. He denies any fever, CP, SOB, cough, abdominal pain, or n/v/d. He reports that he has been staying at [facility] and he has been trying to get another room with no success. He denies seeing the bed bugs. No alleviating factors noted. States bites are pruritic.  No fever, chills.  No NKDA.   Past Medical History  Diagnosis Date  . HIV disease (HCC)     CD4 488, viral load undetectable on 12/2010  . Bronchitis   . Asthma   . Hypertension    Past Surgical History  Procedure Laterality Date  . Hand surgery     Family History  Problem Relation Age of Onset  . Hypertension Mother   . Diabetes Mother    Social History  Substance Use Topics  . Smoking status: Current Some Day Smoker -- 0.30 packs/day    Types: Cigarettes  . Smokeless tobacco: Never Used  . Alcohol Use: No    Review of Systems  Constitutional: Negative for fever.  Respiratory: Negative for cough and shortness of breath.   Cardiovascular: Negative for chest pain.  Gastrointestinal: Negative for nausea, vomiting, abdominal pain and diarrhea.  Skin: Positive for rash.  All other systems reviewed and are negative.     Allergies  Review of patient's allergies indicates no known allergies.  Home Medications    Prior to Admission medications   Medication Sig Start Date End Date Taking? Authorizing Provider  Abacavir-Dolutegravir-Lamivud (TRIUMEQ) 600-50-300 MG TABS Take 1 tablet by mouth daily. 02/01/14   Randall Hissornelius N Van Dam, MD  albuterol (PROVENTIL HFA;VENTOLIN HFA) 108 (90 BASE) MCG/ACT inhaler Inhale 1-2 puffs into the lungs every 6 (six) hours as needed for wheezing or shortness of breath.    Historical Provider, MD  doxycycline (VIBRAMYCIN) 100 MG capsule Take 1 capsule (100 mg total) by mouth 2 (two) times daily. 04/26/14   Linwood DibblesJon Knapp, MD  HYDROcodone-acetaminophen (NORCO/VICODIN) 5-325 MG per tablet Take 1-2 tablets by mouth every 4 (four) hours as needed. 04/26/14   Linwood DibblesJon Knapp, MD  hydrocortisone cream 1 % Apply to affected area 2 times daily Patient not taking: Reported on 12/21/2013 11/26/13   Marisa Severinlga Otter, MD  lisinopril (PRINIVIL,ZESTRIL) 20 MG tablet Take 1 tablet (20 mg total) by mouth daily. 09/28/13   Randall Hissornelius N Van Dam, MD  megestrol (MEGACE) 40 MG/ML suspension TAKE 10ML BY MOUTH TWICE DAILY 10/18/14   Randall Hissornelius N Van Dam, MD  QVAR 80 MCG/ACT inhaler INHALE 1 PUFF BY MOUTH TWO TIMES DAILY 10/18/14   Randall Hissornelius N Van Dam, MD  sildenafil (VIAGRA) 50 MG tablet Take 1 tablet (50 mg total) by mouth daily as needed for erectile dysfunction. 02/01/14   Randall Hissornelius N Van Dam, MD   BP 129/82 mmHg  Pulse 82  Temp(Src) 98.2 F (36.8 C) (Oral)  Resp 16  SpO2 100%   Physical Exam  Constitutional: He is oriented to person, place, and time. He appears well-developed and well-nourished. No distress.  HENT:  Head: Normocephalic and atraumatic.  Mouth/Throat: Oropharynx is clear and moist.  No oral lesions  Eyes: Conjunctivae and EOM are normal. Pupils are equal, round, and reactive to light.  Neck: Normal range of motion. Neck supple.  Cardiovascular: Normal rate, regular rhythm and normal heart sounds.   Pulmonary/Chest: Effort normal and breath sounds normal. No respiratory distress. He has no wheezes.   Musculoskeletal: Normal range of motion. He exhibits no edema.  Neurological: He is alert and oriented to person, place, and time.  Skin: Skin is warm and dry. He is not diaphoretic.  Multiple bites noted to posterior neck and bilateral upper extremities. Few bites noted to chest and back.  No signs of superimposed infection or cellulitis. No lesions on palms or soles. No swelling noted.  Psychiatric: He has a normal mood and affect.  Nursing note and vitals reviewed.   ED Course  Procedures (including critical care time) DIAGNOSTIC STUDIES: Oxygen Saturation is 100% on RA, normal by my interpretation.    COORDINATION OF CARE: 10:25 AM- Pt advised of plan for treatment and pt agrees. Pt will be given topical cream to treat for bed bugs. Advised to inform the [facility] to strip the sheets and take the necessary actions to clean the room.    Labs Review Labs Reviewed - No data to display  Imaging Review No results found.    EKG Interpretation None      MDM   Final diagnoses:  Insect bites   30 year old male here with multiple bug bites which he noticed 2 days ago. Currently he is staying at a hotel for the past 2 weeks. Exam findings are consistent with bedbugs. There are no signs of superimposed infection or cellulitis. No lesions on palms or soles. Patient be started on permethrin cream. He is instructed to notify hotel, make sure all sheets and all linens are washed properly in hot water. He is to follow-up with his PCP.  Discussed plan with patient, he/she acknowledged understanding and agreed with plan of care.  Return precautions given for new or worsening symptoms.  I personally performed the services described in this documentation, which was scribed in my presence. The recorded information has been reviewed and is accurate.  Garlon Hatchet, PA-C 12/07/14 1034  Marily Memos, MD 12/08/14 [phone]

## 2014-12-07 NOTE — Discharge Instructions (Signed)
Take the prescribed medication as directed.  Make sure that hotel washes her sheets today. It would be better if you could change rooms or possibly stay at a different hotel until they take care of this issue. Follow-up with your primary care physician. Return to the ED for new or worsening symptoms.

## 2014-12-08 ENCOUNTER — Encounter (HOSPITAL_COMMUNITY): Payer: Self-pay | Admitting: Emergency Medicine

## 2014-12-08 ENCOUNTER — Emergency Department (HOSPITAL_COMMUNITY)
Admission: EM | Admit: 2014-12-08 | Discharge: 2014-12-08 | Disposition: A | Discharge status: Home / Self Care | Payer: Medicare Other | Attending: Emergency Medicine | Admitting: Emergency Medicine

## 2014-12-08 DIAGNOSIS — Z21 Asymptomatic human immunodeficiency virus [HIV] infection status: Secondary | ICD-10-CM | POA: Insufficient documentation

## 2014-12-08 DIAGNOSIS — R21 Rash and other nonspecific skin eruption: Secondary | ICD-10-CM

## 2014-12-08 DIAGNOSIS — D849 Immunodeficiency, unspecified: Secondary | ICD-10-CM | POA: Insufficient documentation

## 2014-12-08 DIAGNOSIS — Z720 Tobacco use: Secondary | ICD-10-CM | POA: Diagnosis not present

## 2014-12-08 DIAGNOSIS — Z79899 Other long term (current) drug therapy: Secondary | ICD-10-CM | POA: Diagnosis not present

## 2014-12-08 DIAGNOSIS — J45909 Unspecified asthma, uncomplicated: Secondary | ICD-10-CM | POA: Diagnosis not present

## 2014-12-08 DIAGNOSIS — I10 Essential (primary) hypertension: Secondary | ICD-10-CM | POA: Insufficient documentation

## 2014-12-08 DIAGNOSIS — Z792 Long term (current) use of antibiotics: Secondary | ICD-10-CM | POA: Diagnosis not present

## 2014-12-08 MED ORDER — HYDROXYZINE HCL 25 MG PO TABS: 25.0000 mg | ORAL_TABLET | Freq: Four times a day (QID) | ORAL | Status: AC

## 2014-12-08 NOTE — Discharge Instructions (Signed)
Please read and follow all provided instructions.  Your diagnoses today include:  1. Rash    Tests performed today include:  Vital signs. See below for your results today.   Medications prescribed:   Hydroxyzine - antihistamine  You can find this medication over-the-counter.   This medication will make you drowsy. DO NOT drive or perform any activities that require you to be awake and alert if taking this.  Take any prescribed medications only as directed.  Home care instructions:  Follow any educational materials contained in this packet.  BE VERY CAREFUL not to take multiple medicines containing Tylenol (also called acetaminophen). Doing so can lead to an overdose which can damage your liver and cause liver failure and possibly death.   Follow-up instructions: Please follow-up with Dr. Daiva EvesVan Dam in the next 7 days for further evaluation of your symptoms.   Return instructions:   Please return to the Emergency Department if you experience worsening symptoms.   Return with worsening symptoms, fever, uncontrolled pain or other concerns.  Please return if you have any other emergent concerns.  Additional Information:  Your vital signs today were: BP 130/68 mmHg   Pulse 99   Temp(Src) 98.8 F (37.1 C) (Oral)   Resp 16   SpO2 97% If your blood pressure (BP) was elevated above 135/85 this visit, please have this repeated by your doctor within one month. --------------

## 2014-12-08 NOTE — ED Notes (Signed)
Pt stated that he was treated for bed bugs 2 days ago. Pt reports burning , itching, and pain in skin after applying cream

## 2014-12-08 NOTE — ED Provider Notes (Signed)
CSN: [id number]     Arrival date & time 12/08/14  1427 History  By signing my name below, I, Placido Sou, attest that this documentation has been prepared under the direction and in the presence of Renne Crigler, PA-C. Electronically Signed: Placido Sou, ED Scribe. 12/08/2014. 3:01 PM.   Chief Complaint  Patient presents with  . Rash    possible allergic reaction   The history is provided by the patient. No language interpreter was used.    HPI Comments: [name] is a 29 y.o. male with a hx of HIV (Cd4 counts recently > 500s) who presents to the Emergency Department complaining of a moderate, diffuse, rash to his torso, shoulders and neck with onset 3 days ago. Pt currently resides in a hotel and was seen yesterday for the same symptoms which were consistent with bed bugs and began applying the prescribed permethrin cream which he denies provided any relief. Symptoms started after moving to a new room. He notes associated, mild, itchiness, pain and burning. He notes a worsening of his burning when in the shower and when clothes touch the area. He notes taking his medications for HIV as prescribed. Pt has previous history of shingles. He does not feel like this is similar to previous shingles rash. Pt denies any discharge from the affected regions or other associated symptoms at this time.     Past Medical History  Diagnosis Date  . HIV disease (HCC)     CD4 488, viral load undetectable on 12/2010  . Bronchitis   . Asthma   . Hypertension    Past Surgical History  Procedure Laterality Date  . Hand surgery     Family History  Problem Relation Age of Onset  . Hypertension Mother   . Diabetes Mother    Social History  Substance Use Topics  . Smoking status: Current Some Day Smoker -- 0.30 packs/day    Types: Cigarettes  . Smokeless tobacco: Never Used  . Alcohol Use: No    Review of Systems  Constitutional: Negative for fever and chills.  HENT: Negative for  facial swelling and trouble swallowing.   Eyes: Negative for redness.  Respiratory: Negative for shortness of breath, wheezing and stridor.   Cardiovascular: Negative for chest pain.  Gastrointestinal: Negative for nausea and vomiting.  Musculoskeletal: Negative for myalgias.  Skin: Positive for rash.  Allergic/Immunologic: Positive for immunocompromised state.  Neurological: Negative for light-headedness.  Psychiatric/Behavioral: Negative for confusion.   Allergies  Review of patient's allergies indicates no known allergies.  Home Medications   Prior to Admission medications   Medication Sig Start Date End Date Taking? Authorizing Provider  Abacavir-Dolutegravir-Lamivud (TRIUMEQ) 600-50-300 MG TABS Take 1 tablet by mouth daily. 02/01/14   Randall Hiss, MD  albuterol (PROVENTIL HFA;VENTOLIN HFA) 108 (90 BASE) MCG/ACT inhaler Inhale 1-2 puffs into the lungs every 6 (six) hours as needed for wheezing or shortness of breath.    Historical Provider, MD  doxycycline (VIBRAMYCIN) 100 MG capsule Take 1 capsule (100 mg total) by mouth 2 (two) times daily. 04/26/14   Linwood Dibbles, MD  HYDROcodone-acetaminophen (NORCO/VICODIN) 5-325 MG per tablet Take 1-2 tablets by mouth every 4 (four) hours as needed. 04/26/14   Linwood Dibbles, MD  hydrocortisone cream 1 % Apply to affected area 2 times daily Patient not taking: Reported on 12/21/2013 11/26/13   Marisa Severin, MD  lisinopril (PRINIVIL,ZESTRIL) 20 MG tablet Take 1 tablet (20 mg total) by mouth daily. 09/28/13   Lisette Grinder  Daiva EvesVan Dam, MD  megestrol (MEGACE) 40 MG/ML suspension TAKE 10ML BY MOUTH TWICE DAILY 10/18/14   Randall Hissornelius N Van Dam, MD  permethrin (ELIMITE) 5 % cream Apply to entire body other than face - let sit for 14 hours then wash off, may repeat in 1 week if still having symptoms 12/07/14   Garlon HatchetLisa M Sanders, PA-C  QVAR 80 MCG/ACT inhaler INHALE 1 PUFF BY MOUTH TWO TIMES DAILY 10/18/14   Randall Hissornelius N Van Dam, MD  sildenafil (VIAGRA) 50 MG tablet Take 1  tablet (50 mg total) by mouth daily as needed for erectile dysfunction. 02/01/14   Randall Hissornelius N Van Dam, MD   BP 130/68 mmHg  Pulse 99  Temp(Src) 98.8 F (37.1 C) (Oral)  Resp 16  SpO2 97% Physical Exam  Constitutional: He appears well-developed and well-nourished.  HENT:  Head: Normocephalic and atraumatic.  Mouth/Throat: Oropharynx is clear and moist.  Eyes: Conjunctivae are normal. Right eye exhibits no discharge. Left eye exhibits no discharge.  Neck: Normal range of motion. Neck supple.  Cardiovascular: Normal rate, regular rhythm and normal heart sounds.   Pulmonary/Chest: Effort normal and breath sounds normal.  Abdominal: Soft. There is no tenderness.  Neurological: He is alert.  Skin: Skin is warm and dry.  Patient with multiple areas of scattered vesicles bilaterally most prominent over the neck and upper torso. Patient also has some isolated papules noted on the left forearm and lower back. No drainage. These are not quite vesicular at the current time. No associated cellulitis.  Psychiatric: He has a normal mood and affect.  Nursing note and vitals reviewed.   ED Course  Procedures  DIAGNOSTIC STUDIES: Oxygen Saturation is 97% on RA, normal by my interpretation.    COORDINATION OF CARE: 2:59 PM Pt presents today due to a worsening rash. Discussed next steps with pt including reevaluation by physician. Pt agreed to plan.  Labs Review Labs Reviewed - No data to display  Imaging Review No results found.   EKG Interpretation None       3:33 PM Patient discussed with and seen by Dr. Madilyn Hookees. Unclear etiology at this point. Consider allergic reaction versus disseminated shingles. Will give hydroxyzine for itching. Otherwise patient appears well. Feel that the best plan of action at this time is for him to follow up with his infectious disease doctor for further evaluation and recheck.  Patient encouraged to use medications as prescribed. Return the emergency department  with worsening symptoms or other concerns.  MDM   Final diagnoses:  Rash   Patient with rash as described above. Unclear etiology as described above. Will have him follow-up with his infectious disease doctors for continued evaluation. No systemic symptoms of illness. No concern for SJS/TEN. No concern for anaphylaxis.   I personally performed the services described in this documentation, which was scribed in my presence. The recorded information has been reviewed and is accurate.     Renne CriglerJoshua Tavone Caesar, PA-C 12/08/14 1535  Tilden FossaElizabeth Rees, MD 12/08/14 1540

## 2014-12-15 DIAGNOSIS — F1920 Other psychoactive substance dependence, uncomplicated: Secondary | ICD-10-CM | POA: Diagnosis not present

## 2014-12-15 DIAGNOSIS — Z5181 Encounter for therapeutic drug level monitoring: Secondary | ICD-10-CM | POA: Diagnosis not present

## 2014-12-28 DIAGNOSIS — Z5181 Encounter for therapeutic drug level monitoring: Secondary | ICD-10-CM | POA: Diagnosis not present

## 2014-12-28 DIAGNOSIS — F1920 Other psychoactive substance dependence, uncomplicated: Secondary | ICD-10-CM | POA: Diagnosis not present

## 2015-01-03 ENCOUNTER — Encounter (HOSPITAL_COMMUNITY): Payer: Self-pay | Admitting: Emergency Medicine

## 2015-01-03 ENCOUNTER — Emergency Department (HOSPITAL_COMMUNITY): Payer: Medicare Other

## 2015-01-03 ENCOUNTER — Other Ambulatory Visit: Payer: Medicare Other

## 2015-01-03 ENCOUNTER — Emergency Department (HOSPITAL_COMMUNITY)
Admission: EM | Admit: 2015-01-03 | Discharge: 2015-01-03 | Disposition: A | Discharge status: Home / Self Care | Payer: Medicare Other | Attending: Emergency Medicine | Admitting: Emergency Medicine

## 2015-01-03 DIAGNOSIS — S80812A Abrasion, left lower leg, initial encounter: Secondary | ICD-10-CM | POA: Insufficient documentation

## 2015-01-03 DIAGNOSIS — Z7951 Long term (current) use of inhaled steroids: Secondary | ICD-10-CM | POA: Diagnosis not present

## 2015-01-03 DIAGNOSIS — R011 Cardiac murmur, unspecified: Secondary | ICD-10-CM | POA: Insufficient documentation

## 2015-01-03 DIAGNOSIS — I10 Essential (primary) hypertension: Secondary | ICD-10-CM | POA: Insufficient documentation

## 2015-01-03 DIAGNOSIS — Z792 Long term (current) use of antibiotics: Secondary | ICD-10-CM | POA: Diagnosis not present

## 2015-01-03 DIAGNOSIS — S161XXA Strain of muscle, fascia and tendon at neck level, initial encounter: Secondary | ICD-10-CM | POA: Diagnosis not present

## 2015-01-03 DIAGNOSIS — M79662 Pain in left lower leg: Secondary | ICD-10-CM | POA: Diagnosis not present

## 2015-01-03 DIAGNOSIS — F17210 Nicotine dependence, cigarettes, uncomplicated: Secondary | ICD-10-CM | POA: Insufficient documentation

## 2015-01-03 DIAGNOSIS — Y998 Other external cause status: Secondary | ICD-10-CM | POA: Diagnosis not present

## 2015-01-03 DIAGNOSIS — Y9389 Activity, other specified: Secondary | ICD-10-CM | POA: Insufficient documentation

## 2015-01-03 DIAGNOSIS — Z7952 Long term (current) use of systemic steroids: Secondary | ICD-10-CM | POA: Insufficient documentation

## 2015-01-03 DIAGNOSIS — Y92410 Unspecified street and highway as the place of occurrence of the external cause: Secondary | ICD-10-CM | POA: Diagnosis not present

## 2015-01-03 DIAGNOSIS — M542 Cervicalgia: Secondary | ICD-10-CM | POA: Diagnosis not present

## 2015-01-03 DIAGNOSIS — Z79899 Other long term (current) drug therapy: Secondary | ICD-10-CM | POA: Diagnosis not present

## 2015-01-03 DIAGNOSIS — B20 Human immunodeficiency virus [HIV] disease: Secondary | ICD-10-CM | POA: Insufficient documentation

## 2015-01-03 DIAGNOSIS — M549 Dorsalgia, unspecified: Secondary | ICD-10-CM | POA: Diagnosis not present

## 2015-01-03 DIAGNOSIS — J45909 Unspecified asthma, uncomplicated: Secondary | ICD-10-CM | POA: Insufficient documentation

## 2015-01-03 DIAGNOSIS — S3992XA Unspecified injury of lower back, initial encounter: Secondary | ICD-10-CM | POA: Diagnosis not present

## 2015-01-03 DIAGNOSIS — S199XXA Unspecified injury of neck, initial encounter: Secondary | ICD-10-CM | POA: Diagnosis present

## 2015-01-03 MED ORDER — CYCLOBENZAPRINE HCL 5 MG PO TABS: 5.0000 mg | ORAL_TABLET | Freq: Three times a day (TID) | ORAL | Status: DC | PRN
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
## 2015-01-03 NOTE — ED Notes (Signed)
Triage postponed until after xray
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
## 2015-01-04 ENCOUNTER — Emergency Department (HOSPITAL_COMMUNITY): Payer: Medicare Other

## 2015-01-04 ENCOUNTER — Emergency Department (HOSPITAL_COMMUNITY)
Admission: EM | Admit: 2015-01-04 | Discharge: 2015-01-05 | Disposition: A | Discharge status: Home / Self Care | Payer: Medicare Other | Attending: Emergency Medicine | Admitting: Emergency Medicine

## 2015-01-04 ENCOUNTER — Encounter (HOSPITAL_COMMUNITY): Payer: Self-pay | Admitting: *Deleted

## 2015-01-04 DIAGNOSIS — R0781 Pleurodynia: Secondary | ICD-10-CM | POA: Diagnosis not present

## 2015-01-04 DIAGNOSIS — F17210 Nicotine dependence, cigarettes, uncomplicated: Secondary | ICD-10-CM | POA: Diagnosis not present

## 2015-01-04 DIAGNOSIS — Z79899 Other long term (current) drug therapy: Secondary | ICD-10-CM | POA: Diagnosis not present

## 2015-01-04 DIAGNOSIS — R0789 Other chest pain: Secondary | ICD-10-CM | POA: Diagnosis not present

## 2015-01-04 DIAGNOSIS — J45909 Unspecified asthma, uncomplicated: Secondary | ICD-10-CM | POA: Diagnosis not present

## 2015-01-04 DIAGNOSIS — G8911 Acute pain due to trauma: Secondary | ICD-10-CM | POA: Diagnosis not present

## 2015-01-04 DIAGNOSIS — B20 Human immunodeficiency virus [HIV] disease: Secondary | ICD-10-CM | POA: Insufficient documentation

## 2015-01-04 DIAGNOSIS — I10 Essential (primary) hypertension: Secondary | ICD-10-CM | POA: Insufficient documentation
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
## 2015-01-05 DIAGNOSIS — R0789 Other chest pain: Secondary | ICD-10-CM | POA: Diagnosis not present

## 2015-01-05 MED ORDER — TRAMADOL HCL 50 MG PO TABS: 50.0000 mg | ORAL_TABLET | Freq: Four times a day (QID) | ORAL | Status: AC | PRN

## 2015-01-05 MED ORDER — CYCLOBENZAPRINE HCL 5 MG PO TABS: 5.0000 mg | ORAL_TABLET | Freq: Three times a day (TID) | ORAL | Status: AC | PRN

## 2015-01-05 MED ORDER — TRAMADOL HCL 50 MG PO TABS
50.0000 mg | ORAL_TABLET | Freq: Once | ORAL | Status: AC
Start: 2015-01-05 — End: 2015-01-05
  Administered 2015-01-05: 50 mg via ORAL
  Filled 2015-01-05: qty 1

## 2015-01-05 NOTE — Discharge Instructions (Signed)
Chest Wall Pain Chest wall pain is pain in or around the bones and muscles of your chest. Sometimes, an injury causes this pain. Sometimes, the cause may not be known. This pain may take several weeks or longer to get better. HOME CARE INSTRUCTIONS  Pay attention to any changes in your symptoms. Take these actions to help with your pain:   Rest as told by your health care provider.   Avoid activities that cause pain. These include any activities that use your chest muscles or your abdominal and side muscles to lift heavy items.   If directed, apply ice to the painful area:  Put ice in a plastic bag.  Place a towel between your skin and the bag.  Leave the ice on for 20 minutes, 2-3 times per day.  Take over-the-counter and prescription medicines only as told by your health care provider.  Do not use tobacco products, including cigarettes, chewing tobacco, and e-cigarettes. If you need help quitting, ask your health care provider.  Keep all follow-up visits as told by your health care provider. This is important. SEEK MEDICAL CARE IF:  You have a fever.  Your chest pain becomes worse.  You have new symptoms. SEEK IMMEDIATE MEDICAL CARE IF:  You have nausea or vomiting.  You feel sweaty or light-headed.  You have a cough with phlegm (sputum) or you cough up blood.  You develop shortness of breath.   This information is not intended to replace advice given to you by your health care provider. Make sure you discuss any questions you have with your health care provider.   Document Released: 01/13/2005 Document Revised: 10/04/2014 Document Reviewed: 04/10/2014 Elsevier Interactive Patient Education 2016 [employer] [place] for Ultram for pain control as well as Flexeril for muscle spasm.  Please take these as directed.  Follow-up with your primary care physician as needed

## 2015-01-08 ENCOUNTER — Other Ambulatory Visit: Payer: Self-pay | Admitting: Infectious Disease

## 2015-01-11 ENCOUNTER — Other Ambulatory Visit: Payer: Self-pay | Admitting: Pharmacist Clinician (PhC)/ Clinical Pharmacy Specialist

## 2015-01-11 ENCOUNTER — Telehealth: Payer: Self-pay | Admitting: *Deleted

## 2015-01-11 DIAGNOSIS — Z5181 Encounter for therapeutic drug level monitoring: Secondary | ICD-10-CM | POA: Diagnosis not present

## 2015-01-11 DIAGNOSIS — F1920 Other psychoactive substance dependence, uncomplicated: Secondary | ICD-10-CM | POA: Diagnosis not present

## 2015-01-11 NOTE — Telephone Encounter (Signed)
Kees, I believe you switched him off of TRV/DRV/r the last he was here to Triumeq. Triumeq should not have any issue with fluticasone (Flovent). I would change the Qvar to Flovent HFA 110, 1 puff BID.

## 2015-01-11 NOTE — Telephone Encounter (Signed)
It needs to NOT interact with COBI

## 2015-01-11 NOTE — Telephone Encounter (Signed)
Qvar not covered by patient's insurance, preferred other rxes see next:  Serevant, Spiriva, Arnuity, or Flovent.  MD please advise.

## 2015-01-12 MED ORDER — FLUTICASONE PROPIONATE HFA 110 MCG/ACT IN AERO: 1.0000 | INHALATION_SPRAY | Freq: Two times a day (BID) | RESPIRATORY_TRACT | Status: AC

## 2015-01-12 NOTE — Telephone Encounter (Signed)
In that case flonase is perfectly fine

## 2015-01-12 NOTE — Telephone Encounter (Signed)
Pharmacy will notify the pt of the rx change.

## 2015-01-12 NOTE — Addendum Note (Signed)
Addended by: Jennet MaduroESTRIDGE, DENISE D on: 01/12/2015 04:05 PM   Modules accepted: Orders, Medications

## 2015-01-31 ENCOUNTER — Telehealth: Payer: Self-pay | Admitting: *Deleted

## 2015-01-31 ENCOUNTER — Ambulatory Visit: Payer: Medicare Other | Admitting: Infectious Disease

## 2015-01-31 NOTE — Telephone Encounter (Addendum)
Patient no-showed 01/31/15.  Last seen in office 01/2014.  Multiple ED visits in the mean time. Attempted to call patient at listed phone numbers.  Contacted pharmacy for correct phone number.  The only correct number is now listed as his stepmother's cell - his emergency contact.  Per pharmacy, patient has been receiving regular shipments of triumeq and megace. [facility] Andree CossHowell, Michelle M, RN

## 2015-02-06 ENCOUNTER — Other Ambulatory Visit: Payer: Self-pay | Admitting: *Deleted

## 2015-02-06 DIAGNOSIS — I10 Essential (primary) hypertension: Secondary | ICD-10-CM

## 2015-02-07 ENCOUNTER — Telehealth: Payer: Self-pay | Admitting: *Deleted

## 2015-02-07 ENCOUNTER — Other Ambulatory Visit: Payer: Self-pay | Admitting: Infectious Disease

## 2015-02-07 DIAGNOSIS — R634 Abnormal weight loss: Secondary | ICD-10-CM

## 2015-02-07 DIAGNOSIS — Z79899 Other long term (current) drug therapy: Secondary | ICD-10-CM

## 2015-02-07 DIAGNOSIS — Z113 Encounter for screening for infections with a predominantly sexual mode of transmission: Secondary | ICD-10-CM

## 2015-02-07 DIAGNOSIS — I10 Essential (primary) hypertension: Secondary | ICD-10-CM

## 2015-02-07 DIAGNOSIS — B20 Human immunodeficiency virus [HIV] disease: Secondary | ICD-10-CM

## 2015-02-07 MED ORDER — ABACAVIR-DOLUTEGRAVIR-LAMIVUD 600-50-300 MG PO TABS: 1.0000 | ORAL_TABLET | Freq: Every day | ORAL | Status: AC

## 2015-02-07 NOTE — Telephone Encounter (Signed)
I DO want to fill his scripts it is not worth if for him to become viremic

## 2015-02-07 NOTE — Telephone Encounter (Signed)
Unable to refill rxes, pt's last appt 02/01/14.  Notified [facility] by fax.  Notified pt's stepmother.   She is aware that the pt needs to make and keep appt for future prescriptions.  Unable to speak directly with the pt.

## 2015-02-08 MED ORDER — LISINOPRIL 20 MG PO TABS: 20.0000 mg | ORAL_TABLET | Freq: Every day | ORAL | Status: AC

## 2015-02-08 NOTE — Addendum Note (Signed)
Addended by: Jennet MaduroESTRIDGE, DENISE D on: 02/08/2015 04:13 PM   Modules accepted: Orders

## 2015-02-15 NOTE — Telephone Encounter (Signed)
Left message for the pt to call [facility] to make a MD appointment ASAP.

## 2015-05-08 ENCOUNTER — Other Ambulatory Visit: Payer: Self-pay | Admitting: Pharmacist Clinician (PhC)/ Clinical Pharmacy Specialist

## 2015-06-07 ENCOUNTER — Emergency Department (HOSPITAL_COMMUNITY)
Admission: EM | Admit: 2015-06-07 | Discharge: 2015-06-07 | Disposition: A | Discharge status: Home / Self Care | Payer: Medicare Other | Attending: Emergency Medicine | Admitting: Emergency Medicine

## 2015-06-07 ENCOUNTER — Encounter (HOSPITAL_COMMUNITY): Payer: Self-pay | Admitting: Emergency Medicine

## 2015-06-07 DIAGNOSIS — Z79899 Other long term (current) drug therapy: Secondary | ICD-10-CM | POA: Insufficient documentation

## 2015-06-07 DIAGNOSIS — I10 Essential (primary) hypertension: Secondary | ICD-10-CM | POA: Insufficient documentation

## 2015-06-07 DIAGNOSIS — Z7951 Long term (current) use of inhaled steroids: Secondary | ICD-10-CM | POA: Insufficient documentation

## 2015-06-07 DIAGNOSIS — L02214 Cutaneous abscess of groin: Secondary | ICD-10-CM | POA: Insufficient documentation

## 2015-06-07 DIAGNOSIS — L02211 Cutaneous abscess of abdominal wall: Secondary | ICD-10-CM | POA: Diagnosis not present

## 2015-06-07 DIAGNOSIS — J45909 Unspecified asthma, uncomplicated: Secondary | ICD-10-CM | POA: Diagnosis not present

## 2015-06-07 DIAGNOSIS — F17210 Nicotine dependence, cigarettes, uncomplicated: Secondary | ICD-10-CM | POA: Insufficient documentation

## 2015-06-07 DIAGNOSIS — B20 Human immunodeficiency virus [HIV] disease: Secondary | ICD-10-CM | POA: Insufficient documentation

## 2015-06-07 MED ORDER — IBUPROFEN 400 MG PO TABS
800.0000 mg | ORAL_TABLET | Freq: Once | ORAL | Status: AC
  Administered 2015-06-07: 800 mg via ORAL
  Filled 2015-06-07: qty 2

## 2015-06-07 MED ORDER — HYDROCODONE-ACETAMINOPHEN 5-325 MG PO TABS: 1.0000 | ORAL_TABLET | Freq: Four times a day (QID) | ORAL | Status: AC | PRN

## 2015-06-07 MED ORDER — DOXYCYCLINE HYCLATE 100 MG PO CAPS: 100.0000 mg | ORAL_CAPSULE | Freq: Two times a day (BID) | ORAL | Status: AC

## 2015-06-07 MED ORDER — LIDOCAINE-EPINEPHRINE (PF) 2 %-1:200000 IJ SOLN
20.0000 mL | Freq: Once | INTRAMUSCULAR | Status: DC
  Filled 2015-06-07: qty 20

## 2015-06-07 NOTE — ED Notes (Signed)
Pt here with 2 boils, started 3 weeks ago, has had to have them drained and packed. One on inner thigh and one on upper pubic area.

## 2015-06-07 NOTE — ED Provider Notes (Signed)
CSN: [id number]     Arrival date & time 06/07/15  1112 History  By signing my name below, I, Emmanuella Mensah, attest that this documentation has been prepared under the direction and in the presence of Fayrene HelperBowie Kaytlan Behrman, PA-C. Electronically Signed: Angelene GiovanniEmmanuella Mensah, ED Scribe. 06/07/2015. 2:07 PM.    Chief Complaint  Patient presents with  . Abscess   The history is provided by the patient. No language interpreter was used.   HPI Comments: [name] is a 30 y.o. male with a hx of HIV and HTN who presents to the Emergency Department complaining of gradually worsening moderately painful non-draining areas of swelling on his suprapubic region and right inner thigh onset 3 weeks ago. Pt explains that he has tried to soak the areas with no significant relief. His tetanus vaccine is UTD. Pt is unsure of his current viral load. No fever, chills, or n/v.     Past Medical History  Diagnosis Date  . HIV disease (HCC)     CD4 488, viral load undetectable on 12/2010  . Bronchitis   . Asthma   . Hypertension    Past Surgical History  Procedure Laterality Date  . Hand surgery     Family History  Problem Relation Age of Onset  . Hypertension Mother   . Diabetes Mother    Social History  Substance Use Topics  . Smoking status: Current Some Day Smoker -- 0.30 packs/day    Types: Cigarettes  . Smokeless tobacco: Never Used  . Alcohol Use: No    Review of Systems  Constitutional: Negative for fever and chills.  Gastrointestinal: Negative for nausea and vomiting.  Skin:       Painful area of swelling  All other systems reviewed and are negative.     Allergies  Review of patient's allergies indicates no known allergies.  Home Medications   Prior to Admission medications   Medication Sig Start Date End Date Taking? Authorizing Provider  abacavir-dolutegravir-lamiVUDine (TRIUMEQ) 600-50-300 MG tablet Take 1 tablet by mouth daily. 02/07/15   Randall Hissornelius N Van Dam, MD  albuterol  (PROVENTIL HFA;VENTOLIN HFA) 108 (90 BASE) MCG/ACT inhaler Inhale 1-2 puffs into the lungs every 6 (six) hours as needed for wheezing or shortness of breath.    Historical Provider, MD  cyclobenzaprine (FLEXERIL) 5 MG tablet Take 1 tablet (5 mg total) by mouth 3 (three) times daily as needed for muscle spasms. 01/05/15   Earley FavorGail Schulz, NP  doxycycline (VIBRAMYCIN) 100 MG capsule Take 1 capsule (100 mg total) by mouth 2 (two) times daily. Patient not taking: Reported on 01/04/2015 04/26/14   Linwood DibblesJon Knapp, MD  fluticasone (FLOVENT HFA) 110 MCG/ACT inhaler Inhale 1 puff into the lungs 2 (two) times daily. 01/12/15   Randall Hissornelius N Van Dam, MD  HYDROcodone-acetaminophen (NORCO/VICODIN) 5-325 MG per tablet Take 1-2 tablets by mouth every 4 (four) hours as needed. Patient not taking: Reported on 01/04/2015 04/26/14   Linwood DibblesJon Knapp, MD  hydrOXYzine (ATARAX/VISTARIL) 25 MG tablet Take 1 tablet (25 mg total) by mouth every 6 (six) hours. Patient not taking: Reported on 01/04/2015 12/08/14   Renne CriglerJoshua Geiple, PA-C  lisinopril (PRINIVIL,ZESTRIL) 20 MG tablet Take 1 tablet (20 mg total) by mouth daily. 02/08/15   Randall Hissornelius N Van Dam, MD  megestrol (MEGACE) 40 MG/ML suspension TAKE 10ML BY MOUTH TWICE DAILY Patient not taking: Reported on 01/04/2015 10/18/14   Randall Hissornelius N Van Dam, MD  permethrin (ELIMITE) 5 % cream Apply to entire body other than face - let  sit for 14 hours then wash off, may repeat in 1 week if still having symptoms Patient not taking: Reported on 01/04/2015 12/07/14   Garlon Hatchet, PA-C  sildenafil (VIAGRA) 50 MG tablet Take 1 tablet (50 mg total) by mouth daily as needed for erectile dysfunction. 02/01/14   Randall Hiss, MD  traMADol (ULTRAM) 50 MG tablet Take 1 tablet (50 mg total) by mouth every 6 (six) hours as needed. 01/05/15   Earley Favor, NP  verapamil (CALAN) 120 MG tablet Take 120 mg by mouth daily.    Historical Provider, MD   BP 114/77 mmHg  Pulse 103  Temp(Src) 99 F (37.2 C) (Oral)  Resp 18   Ht 5\' 9"  (1.753 m)  Wt 147 lb (66.679 kg)  BMI 21.70 kg/m2  SpO2 98% Physical Exam  Constitutional: He is oriented to person, place, and time. He appears well-developed and well-nourished.  HENT:  Head: Normocephalic and atraumatic.  Cardiovascular: Normal rate.   Pulmonary/Chest: Effort normal.  Neurological: He is alert and oriented to person, place, and time.  Skin: Skin is warm and dry.  Area of indurance and fluctance approx 2 cm in diameter noted to subrapubic area with erythematous surrounding area, TTP  Area of indurance and fluctance approx 2 cm in diaetet noted to the right medial thigh, proximal to the peronuem without  any surrounding skin erythema, TTP  Peroneum is soft.   Psychiatric: He has a normal mood and affect.  Nursing note and vitals reviewed.   ED Course  Procedures (including critical care time) DIAGNOSTIC STUDIES: Oxygen Saturation is 98% on RA, normal by my interpretation.    COORDINATION OF CARE: 1:59 PM- Pt advised of plan for treatment and pt agrees. Advised to keep up with viral load level since his treatment may change based on the level. Pt will receive I&D.   INCISION AND DRAINAGE PROCEDURE NOTE: Patient identification was confirmed and verbal consent was obtained. This procedure was performed by Fayrene Helper, PA-C at 2:00 PM. Site: suprapubic Sterile procedures observed Needle size: 25 Anesthetic used (type and amt): lidocaine 2% Blade size: 11 Drainage: mild Complexity: Complex Packing used 1/4 Site anesthetized, incision made over site, wound drained and explored loculations, rinsed with copious amounts of normal saline, wound packed with sterile gauze, covered with dry, sterile dressing.  Pt tolerated procedure well without complications.  Instructions for care discussed verbally and pt provided with additional written instructions for homecare and f/u.   INCISION AND DRAINAGE PROCEDURE NOTE: Patient identification was confirmed and  verbal consent was obtained. This procedure was performed by Fayrene Helper, PA-C at 2:15 PM. Site: R proximal medial thigh Sterile procedures observed Needle size: 25 Anesthetic used (type and amt): lidocaine 2% Blade size: 11 Drainage: mild Complexity: Complex Packing used 1/4 Site anesthetized, incision made over site, wound drained and explored loculations, rinsed with copious amounts of normal saline, wound packed with sterile gauze, covered with dry, sterile dressing.  Pt tolerated procedure well without complications.  Instructions for care discussed verbally and pt provided with additional written instructions for homecare and f/u.   MDM   Final diagnoses:  Cutaneous abscess of groin  Cutaneous abscess of abdominal wall    BP 114/77 mmHg  Pulse 103  Temp(Src) 99 F (37.2 C) (Oral)  Resp 18  Ht 5\' 9"  (1.753 m)  Wt 66.679 kg  BMI 21.70 kg/m2  SpO2 98%  I personally performed the services described in this documentation, which was scribed in my  presence. The recorded information has been reviewed and is accurate.     Fayrene Helper, PA-C 06/07/15 1428  Gwyneth Sprout, MD 06/07/15 2223

## 2015-06-07 NOTE — Discharge Instructions (Signed)
Please continue with warm compress and have your wound recheck in 2 days by your doctor.  Packing will need to be removed in 2 days.    Abscess An abscess is an infected area that contains a collection of pus and debris.It can occur in almost any part of the body. An abscess is also known as a furuncle or boil. CAUSES  An abscess occurs when tissue gets infected. This can occur from blockage of oil or sweat glands, infection of hair follicles, or a minor injury to the skin. As the body tries to fight the infection, pus collects in the area and creates pressure under the skin. This pressure causes pain. People with weakened immune systems have difficulty fighting infections and get certain abscesses more often.  SYMPTOMS Usually an abscess develops on the skin and becomes a painful mass that is red, warm, and tender. If the abscess forms under the skin, you may feel a moveable soft area under the skin. Some abscesses break open (rupture) on their own, but most will continue to get worse without care. The infection can spread deeper into the body and eventually into the bloodstream, causing you to feel ill.  DIAGNOSIS  Your caregiver will take your medical history and perform a physical exam. A sample of fluid may also be taken from the abscess to determine what is causing your infection. TREATMENT  Your caregiver may prescribe antibiotic medicines to fight the infection. However, taking antibiotics alone usually does not cure an abscess. Your caregiver may need to make a small cut (incision) in the abscess to drain the pus. In some cases, gauze is packed into the abscess to reduce pain and to continue draining the area. HOME CARE INSTRUCTIONS   Only take over-the-counter or prescription medicines for pain, discomfort, or fever as directed by your caregiver.  If you were prescribed antibiotics, take them as directed. Finish them even if you start to feel better.  If gauze is used, follow your  caregiver's directions for changing the gauze.  To avoid spreading the infection:  Keep your draining abscess covered with a bandage.  Wash your hands well.  Do not share personal care items, towels, or whirlpools with others.  Avoid skin contact with others.  Keep your skin and clothes clean around the abscess.  Keep all follow-up appointments as directed by your caregiver. SEEK MEDICAL CARE IF:   You have increased pain, swelling, redness, fluid drainage, or bleeding.  You have muscle aches, chills, or a general ill feeling.  You have a fever. MAKE SURE YOU:   Understand these instructions.  Will watch your condition.  Will get help right away if you are not doing well or get worse.   This information is not intended to replace advice given to you by your health care provider. Make sure you discuss any questions you have with your health care provider.   Document Released: 10/23/2004 Document Revised: 07/15/2011 Document Reviewed: 03/28/2011 Elsevier Interactive Patient Education [employer]2016 [employer] Inc.

## 2015-06-12 ENCOUNTER — Encounter: Payer: Self-pay | Admitting: *Deleted

## 2015-06-12 ENCOUNTER — Telehealth: Payer: Self-pay | Admitting: *Deleted

## 2015-06-12 NOTE — Telephone Encounter (Signed)
Error

## 2015-06-12 NOTE — Telephone Encounter (Signed)
Left message.  Pt needing follow-up appt with [facility] MD.

## 2015-06-12 NOTE — Telephone Encounter (Signed)
PA approved for Flovent HFA inhaler through 01/27/16.  [facility] informed.

## 2015-07-12 ENCOUNTER — Encounter (HOSPITAL_COMMUNITY): Payer: Self-pay | Admitting: Emergency Medicine

## 2015-07-12 ENCOUNTER — Emergency Department (HOSPITAL_COMMUNITY)
Admission: EM | Admit: 2015-07-12 | Discharge: 2015-07-12 | Disposition: A | Discharge status: Home / Self Care | Payer: Medicare Other | Attending: Emergency Medicine | Admitting: Emergency Medicine

## 2015-07-12 DIAGNOSIS — I10 Essential (primary) hypertension: Secondary | ICD-10-CM | POA: Insufficient documentation

## 2015-07-12 DIAGNOSIS — J45909 Unspecified asthma, uncomplicated: Secondary | ICD-10-CM | POA: Diagnosis not present

## 2015-07-12 DIAGNOSIS — F17210 Nicotine dependence, cigarettes, uncomplicated: Secondary | ICD-10-CM | POA: Insufficient documentation

## 2015-07-12 DIAGNOSIS — Z79899 Other long term (current) drug therapy: Secondary | ICD-10-CM | POA: Diagnosis not present

## 2015-07-12 DIAGNOSIS — M533 Sacrococcygeal disorders, not elsewhere classified: Secondary | ICD-10-CM | POA: Insufficient documentation

## 2015-07-12 MED ORDER — DOXYCYCLINE HYCLATE 100 MG PO TABS
100.0000 mg | ORAL_TABLET | Freq: Once | ORAL | Status: AC
  Administered 2015-07-12: 100 mg via ORAL
  Filled 2015-07-12: qty 1

## 2015-07-12 MED ORDER — ACETAMINOPHEN 325 MG PO TABS: 650.0000 mg | ORAL_TABLET | Freq: Four times a day (QID) | ORAL | Status: AC | PRN

## 2015-07-12 MED ORDER — HYDROCODONE-ACETAMINOPHEN 5-325 MG PO TABS
1.0000 | ORAL_TABLET | Freq: Once | ORAL | Status: AC
  Administered 2015-07-12: 1 via ORAL
  Filled 2015-07-12: qty 1

## 2015-07-12 MED ORDER — IBUPROFEN 800 MG PO TABS: 800.0000 mg | ORAL_TABLET | Freq: Three times a day (TID) | ORAL | Status: AC

## 2015-07-12 MED ORDER — DOXYCYCLINE HYCLATE 100 MG PO CAPS: 100.0000 mg | ORAL_CAPSULE | Freq: Two times a day (BID) | ORAL | Status: AC

## 2015-07-12 NOTE — ED Notes (Signed)
patient c/o tailbone pain x week and half. Patient denying any injury or bumps.  Patient took percocet on Monday and took pain away for half the day. But pain came back after pain med wore off.

## 2015-07-12 NOTE — Discharge Instructions (Signed)
Please call [facility] to schedule an appointment if your symptoms do not improve. I suspect you have a small abscess forming. Take the entire bottle of antibiotics as prescribed. Take the ibuprofen as needed for pain. Return to the ER for new or worsening symptoms.

## 2015-07-12 NOTE — ED Provider Notes (Signed)
CSN: [id number]     Arrival date & time 07/12/15  1134 History   By signing my name below, I, Evon Slack, attest that this documentation has been prepared under the direction and in the presence of Janeliz Prestwood Y Katira Dumais, [place]. Electronically Signed: Evon Slack, ED Scribe. 07/12/2015. 12:26 PM.     Chief Complaint  Patient presents with  . Tailbone Pain   The history is provided by the patient. No language interpreter was used.   HPI Comments: [name] is a 30 y.o. male with history of HIV and HTN who presents to the Emergency Department complaining of sudden tailbone pain onset 1 week prior. Pt doesn't report any associated symptoms. Pt state that the pain is worse when he lays on his back, sits down, or ambulates. Pt has tried percocet with temporary relief. Denies fever or chills. Denies new numbness or weakness. Denies known injury or trauma. He states he has had hemorrhoids before and this feels different. He is unsure of HIV viral load.  Past Medical History  Diagnosis Date  . HIV disease (HCC)     CD4 488, viral load undetectable on 12/2010  . Bronchitis   . Asthma   . Hypertension    Past Surgical History  Procedure Laterality Date  . Hand surgery     Family History  Problem Relation Age of Onset  . Hypertension Mother   . Diabetes Mother    Social History  Substance Use Topics  . Smoking status: Current Some Day Smoker -- 0.30 packs/day    Types: Cigarettes  . Smokeless tobacco: Never Used  . Alcohol Use: No    Review of Systems  Constitutional: Negative for fever and chills.  All other systems reviewed and are negative.     Allergies  Review of patient's allergies indicates no known allergies.  Home Medications   Prior to Admission medications   Medication Sig Start Date End Date Taking? Authorizing Provider  abacavir-dolutegravir-lamiVUDine (TRIUMEQ) 600-50-300 MG tablet Take 1 tablet by mouth daily. 02/07/15   Randall Hiss, MD   albuterol (PROVENTIL HFA;VENTOLIN HFA) 108 (90 BASE) MCG/ACT inhaler Inhale 1-2 puffs into the lungs every 6 (six) hours as needed for wheezing or shortness of breath.    Historical Provider, MD  cyclobenzaprine (FLEXERIL) 5 MG tablet Take 1 tablet (5 mg total) by mouth 3 (three) times daily as needed for muscle spasms. 01/05/15   Earley Favor, NP  doxycycline (VIBRAMYCIN) 100 MG capsule Take 1 capsule (100 mg total) by mouth 2 (two) times daily. 06/07/15   Fayrene Helper, PA-C  fluticasone (FLOVENT HFA) 110 MCG/ACT inhaler Inhale 1 puff into the lungs 2 (two) times daily. 01/12/15   Randall Hiss, MD  HYDROcodone-acetaminophen (NORCO/VICODIN) 5-325 MG tablet Take 1 tablet by mouth every 6 (six) hours as needed for moderate pain or severe pain. 06/07/15   Fayrene Helper, PA-C  hydrOXYzine (ATARAX/VISTARIL) 25 MG tablet Take 1 tablet (25 mg total) by mouth every 6 (six) hours. Patient not taking: Reported on 01/04/2015 12/08/14   Renne Crigler, PA-C  lisinopril (PRINIVIL,ZESTRIL) 20 MG tablet Take 1 tablet (20 mg total) by mouth daily. 02/08/15   Randall Hiss, MD  megestrol (MEGACE) 40 MG/ML suspension TAKE BY MOUTH TWICE DAILY Patient not taking: Reported on 01/04/2015 10/18/14   Randall Hiss, MD  permethrin (ELIMITE) 5 % cream Apply to entire body other than face - let sit for 14 hours then wash off, may repeat  in 1 week if still having symptoms Patient not taking: Reported on 01/04/2015 12/07/14   Garlon HatchetLisa M Sanders, PA-C  sildenafil (VIAGRA) 50 MG tablet Take 1 tablet (50 mg total) by mouth daily as needed for erectile dysfunction. 02/01/14   Randall Hissornelius N Van Dam, MD  traMADol (ULTRAM) 50 MG tablet Take 1 tablet (50 mg total) by mouth every 6 (six) hours as needed. 01/05/15   Earley FavorGail Schulz, NP  verapamil (CALAN) 120 MG tablet Take 120 mg by mouth daily.    Historical Provider, MD   BP 148/92 mmHg  Pulse 92  Temp(Src) 98.9 F (37.2 C) (Oral)  Resp 18  SpO2 100%   Physical Exam   Constitutional: He is oriented to person, place, and time. He appears well-developed and well-nourished. No distress.  HENT:  Head: Normocephalic and atraumatic.  Eyes: Conjunctivae and EOM are normal.  Neck: Neck supple. No tracheal deviation present.  Cardiovascular: Normal rate.   Pulmonary/Chest: Effort normal. No respiratory distress.  Musculoskeletal: Normal range of motion.  Superior gluteal cleft with small, <0.5 cm area of firm induration. No erythema. This area is tender to palpation. No other midline back tenderness. No stepoff or deformity. Ambulatory with steady gait. 5/5 strength in bilateral UE and LE  Neurological: He is alert and oriented to person, place, and time.  Skin: Skin is warm and dry.  Psychiatric: He has a normal mood and affect. His behavior is normal.  Nursing note and vitals reviewed.   ED Course  Procedures (including critical care time) DIAGNOSTIC STUDIES: Oxygen Saturation is 100% on RA, normal by my interpretation.    COORDINATION OF CARE: 12:26 PM-Discussed treatment plan with pt at bedside and pt agreed to plan.     Labs Review Labs Reviewed - No data to display  Imaging Review No results found.    EKG Interpretation None      MDM   Final diagnoses:  Coccydynia   Suspect pt with forming pilonidal cyst. 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 He states he has some leftover doxycycline from last month when he was seen and treated for two skin abscesses. He is afebrile and nontoxic appearing. Given small size and firm, not particularly fluctuant mass, and pt's apprehension, will trial antibiotic therapy for now with referral to surgery. Will give new rx for doxy. Dose of norco was given here and rx for ibuprofen at home. Also strongly encouraged pt to f/u in ID clinic as it appears he has not been seen by ID in a while. ER return precautions given.   I personally performed the services described in this documentation, which  was scribed in my presence. The recorded information has been reviewed and is accurate.     Carlene CoriaSerena Y Willam Munford, PA-C 07/12/15 1234  Rolland PorterMark James, MD 07/20/15 [phone]

## 2015-07-18 ENCOUNTER — Other Ambulatory Visit: Payer: Self-pay | Admitting: Infectious Disease

## 2015-07-18 DIAGNOSIS — B20 Human immunodeficiency virus [HIV] disease: Secondary | ICD-10-CM

## 2015-08-15 ENCOUNTER — Other Ambulatory Visit: Payer: Self-pay | Admitting: Infectious Disease

## 2015-09-21 ENCOUNTER — Other Ambulatory Visit: Payer: Self-pay | Admitting: Infectious Disease

## 2015-09-21 DIAGNOSIS — B20 Human immunodeficiency virus [HIV] disease: Secondary | ICD-10-CM

## 2015-09-24 NOTE — Telephone Encounter (Signed)
Patient has not had Office Visit since 01/2014.  RN attempted to reach the patient at the phone numbers listed.  Unable to reach him at any of the numbers.  Called [facility] to find out how they reach the patient.  They call the patient's mother, Mayford KnifeMary Cole, when they need to make a delivery.  RN spoke with the patient's mother.  She is going to give him the message to call [facility] to make lab and MD appointments ASAP.

## 2015-10-03 ENCOUNTER — Other Ambulatory Visit: Payer: Medicare Other

## 2015-10-17 ENCOUNTER — Ambulatory Visit: Payer: Medicare Other | Admitting: Infectious Disease

## 2015-12-17 ENCOUNTER — Other Ambulatory Visit: Payer: Medicare Other

## 2015-12-31 ENCOUNTER — Ambulatory Visit: Payer: Medicare Other | Admitting: Infectious Disease

## 2016-01-23 ENCOUNTER — Telehealth: Payer: Self-pay | Admitting: *Deleted

## 2016-01-23 NOTE — Telephone Encounter (Signed)
Received fax asking for confirmation of disability for housing in [place]. Patietn last seen January 2016, has cancelled/no showed multiple appointments since, multiple unsuccessful attempts to contact patient in last 2 years. RN attempted to fax back request notifying them that we have not seen the patient in 2 years, are unable to fulfil this request. [name], Troy M, RN

## 2016-10-20 ENCOUNTER — Other Ambulatory Visit: Payer: Self-pay | Admitting: Infectious Disease

## 2016-10-20 DIAGNOSIS — I10 Essential (primary) hypertension: Secondary | ICD-10-CM
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
